# Patient Record
Sex: Female | Born: 1985 | Race: White | Hispanic: Yes | Marital: Married | Smoking: Never smoker
Health system: Southern US, Community
[De-identification: ages and names within clinical notes are randomized; demographics above are authoritative.]

## PROBLEM LIST (undated history)

## (undated) DIAGNOSIS — N390 Urinary tract infection, site not specified: Secondary | ICD-10-CM

## (undated) DIAGNOSIS — B019 Varicella without complication: Secondary | ICD-10-CM

## (undated) DIAGNOSIS — F32A Depression, unspecified: Secondary | ICD-10-CM

## (undated) DIAGNOSIS — F329 Major depressive disorder, single episode, unspecified: Secondary | ICD-10-CM

## (undated) HISTORY — PX: NO PAST SURGERIES: SHX2092

## (undated) HISTORY — DX: Major depressive disorder, single episode, unspecified: F32.9

## (undated) HISTORY — DX: Varicella without complication: B01.9

## (undated) HISTORY — DX: Urinary tract infection, site not specified: N39.0

## (undated) HISTORY — DX: Depression, unspecified: F32.A

---

## 2008-08-14 ENCOUNTER — Inpatient Hospital Stay (HOSPITAL_COMMUNITY): Admission: AD | Admit: 2008-08-14 | Discharge: 2008-08-14 | Payer: Self-pay | Admitting: Obstetrics and Gynecology

## 2008-10-07 ENCOUNTER — Ambulatory Visit (HOSPITAL_COMMUNITY): Admission: RE | Admit: 2008-10-07 | Discharge: 2008-10-07 | Payer: Self-pay | Admitting: Family Medicine

## 2008-12-26 ENCOUNTER — Ambulatory Visit: Payer: Self-pay | Admitting: Obstetrics and Gynecology

## 2008-12-26 ENCOUNTER — Inpatient Hospital Stay (HOSPITAL_COMMUNITY): Admission: AD | Admit: 2008-12-26 | Discharge: 2008-12-27 | Payer: Self-pay | Admitting: Obstetrics & Gynecology

## 2010-04-22 IMAGING — US US OB DETAIL+14 WK
1 of 2 series · 14 of 28 positions shown · non-contrast
Comparison: none

OBSTETRICAL ULTRASOUND:
 This ultrasound exam was performed in the [HOSPITAL] Ultrasound Department.  The OB US report was generated in the AS system, and faxed to the ordering physician.  This report is also available in [REDACTED] PACS.

[Series 1: us ob detail +14 wk · 0.27mm/px · 14 of 50 slices shown]
[im 1/50]
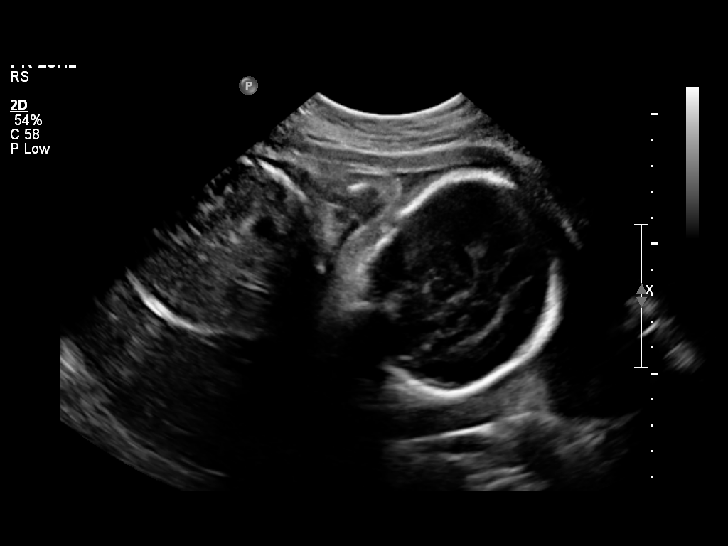
[im 4/50]
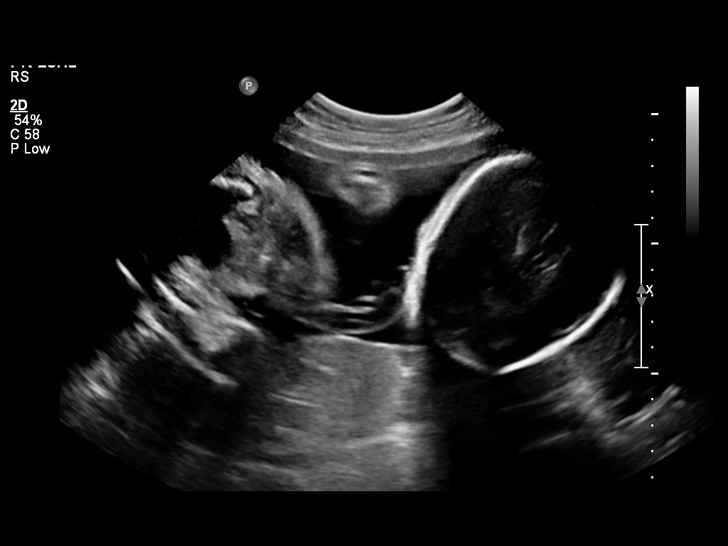
[im 8/50]
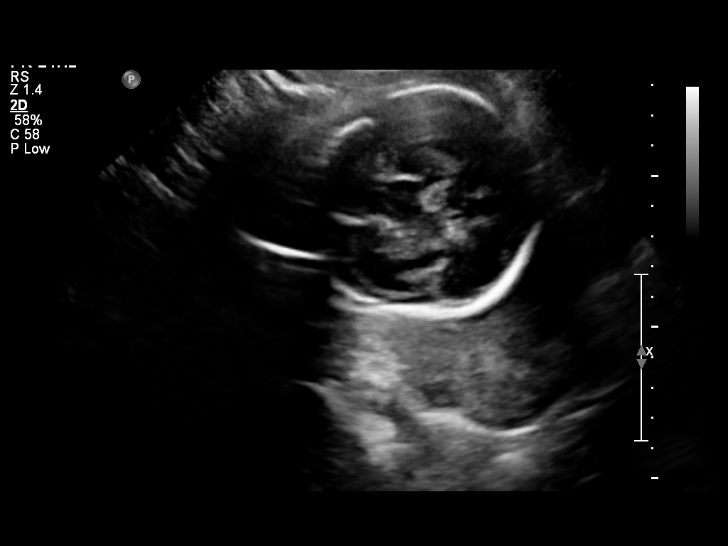
[im 12/50]
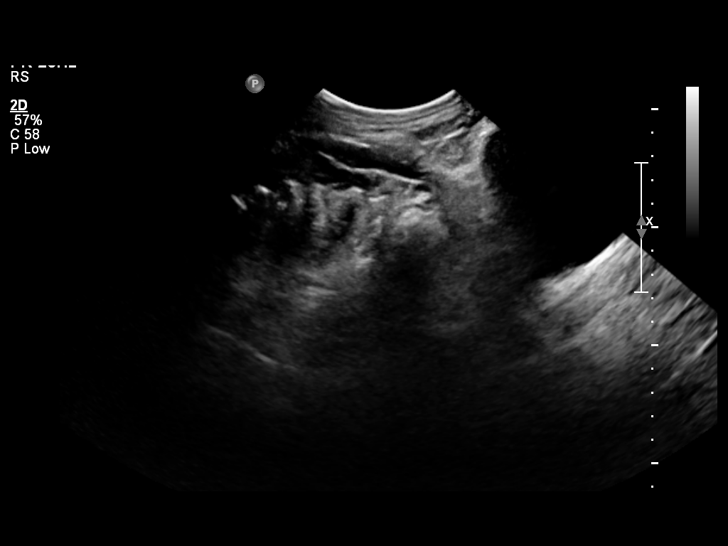
[im 16/50]
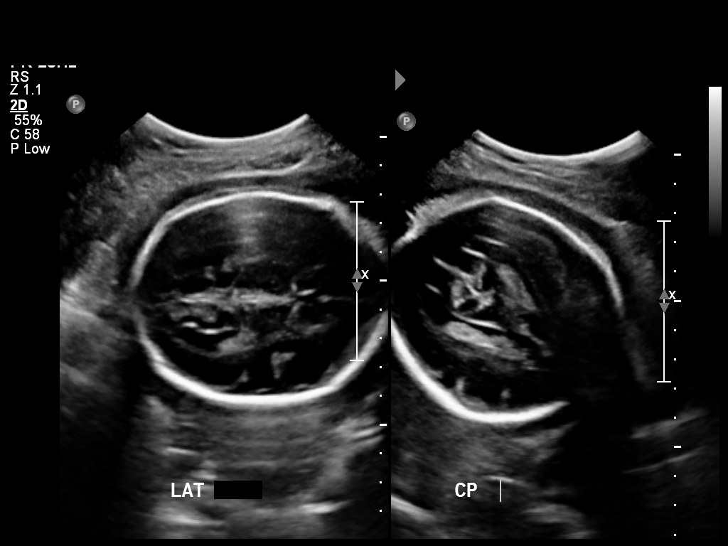
[im 19/50]
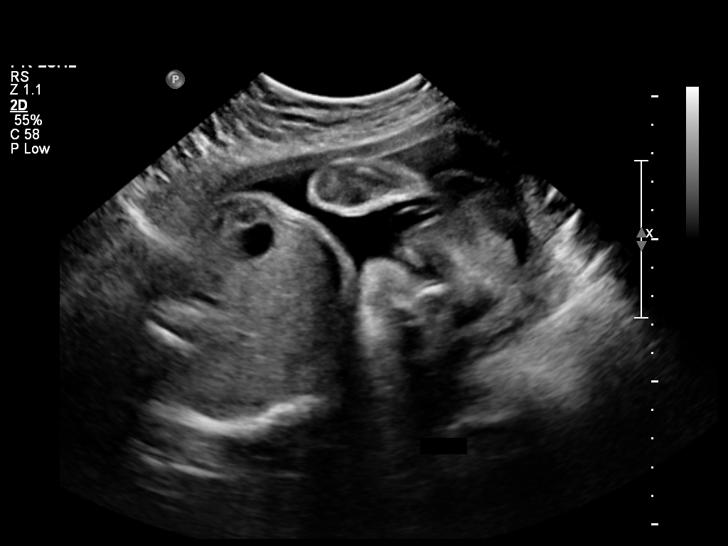
[im 23/50]
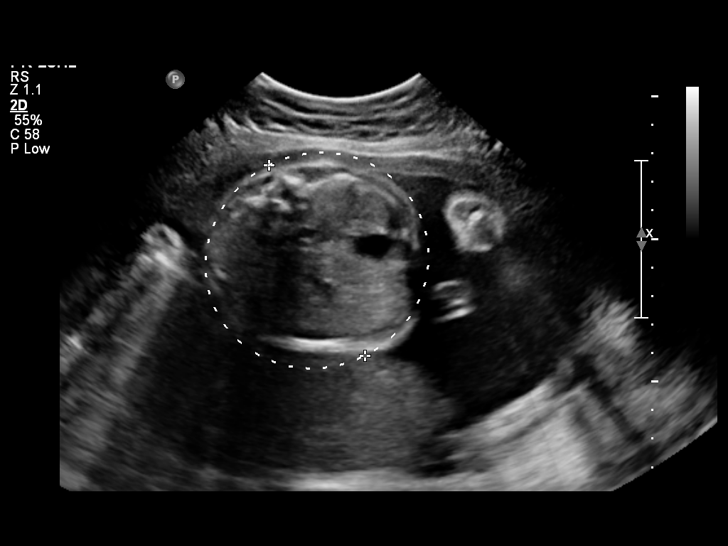
[im 27/50]
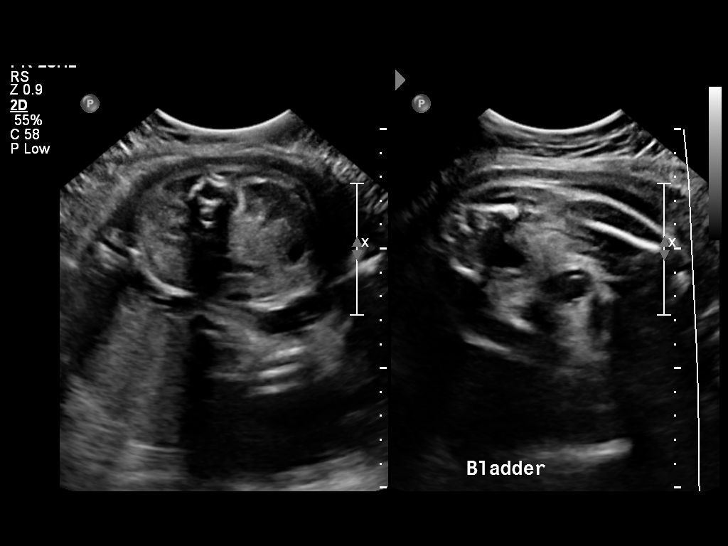
[im 31/50]
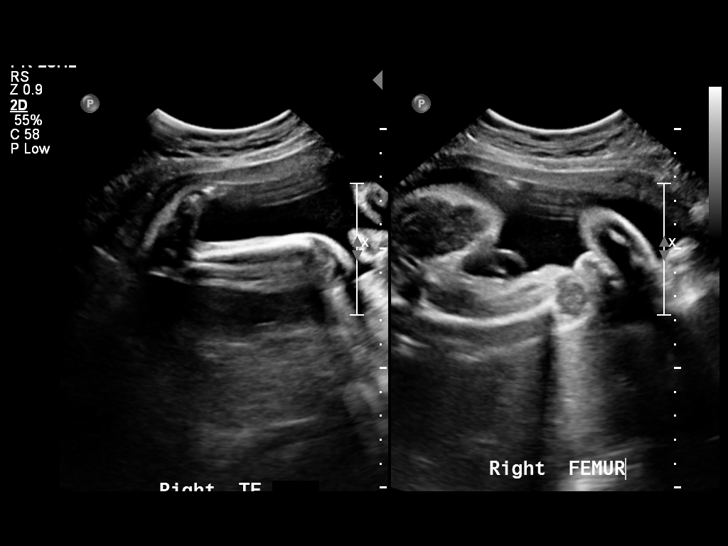
[im 34/50]
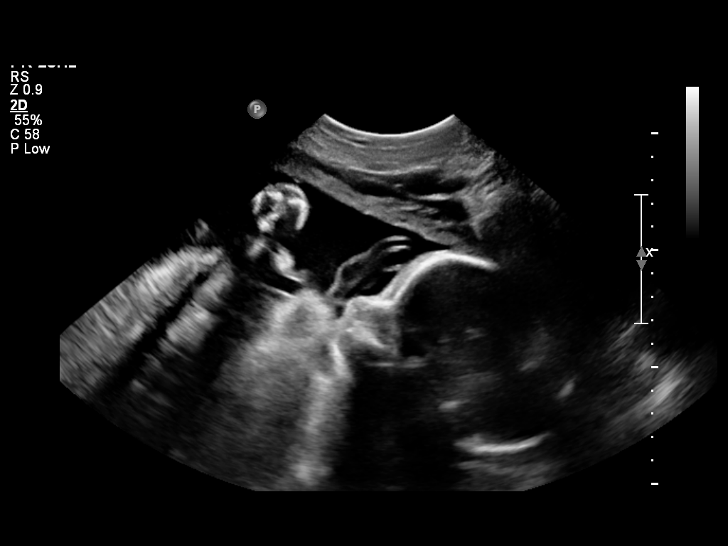
[im 38/50]
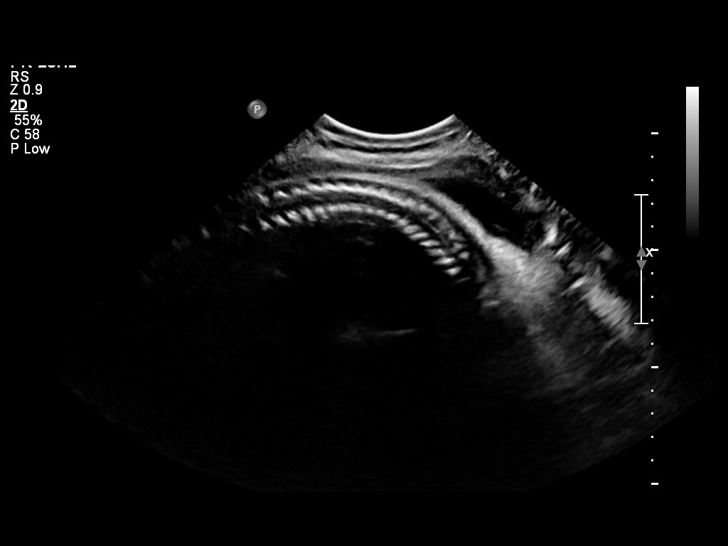
[im 42/50]
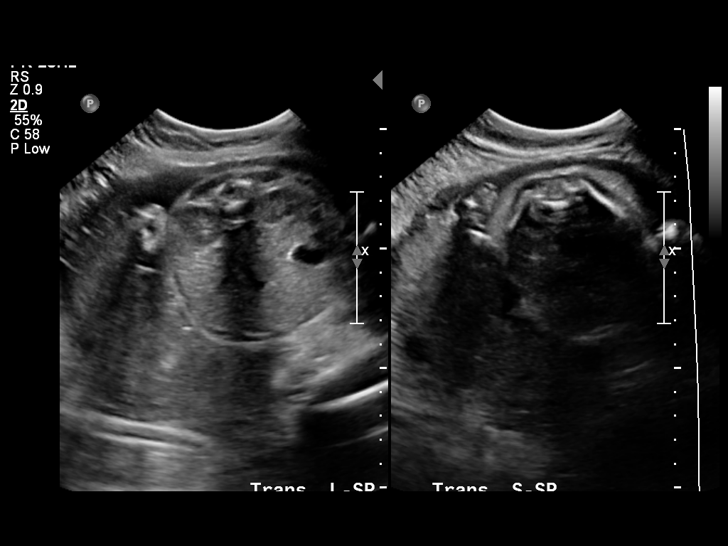
[im 46/50]
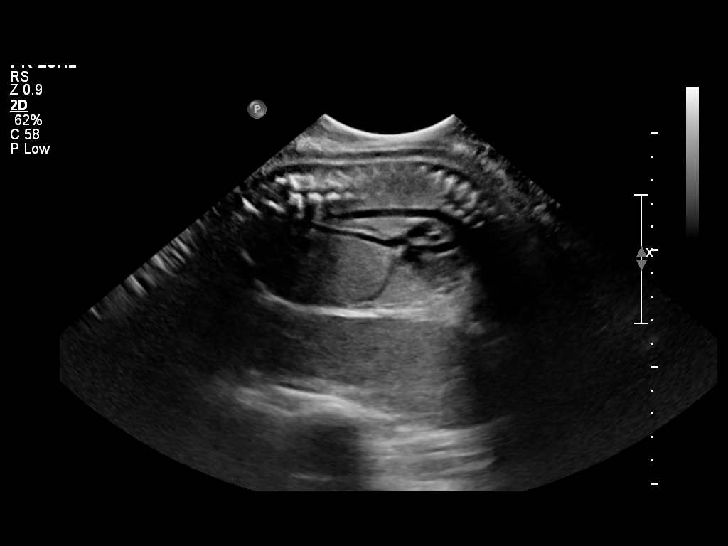
[im 50/50]
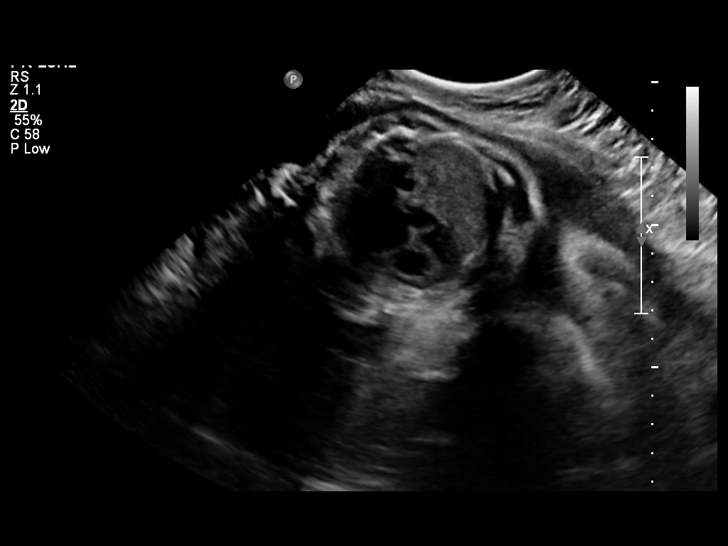

[14 of 28 positions shown; findings below may reference images not displayed]

IMPRESSION: See [facility] Obstetric US report.

## 2010-06-04 LAB — CBC
HCT: 38.6 % (ref 36.0–46.0)
MCV: 94.8 fL (ref 78.0–100.0)
Platelets: 171 10*3/uL (ref 150–400)
RDW: 12.9 % (ref 11.5–15.5)

## 2010-06-08 LAB — WET PREP, GENITAL: Yeast Wet Prep HPF POC: NONE SEEN

## 2013-09-28 ENCOUNTER — Ambulatory Visit: Payer: Self-pay | Admitting: Family Medicine

## 2013-10-26 ENCOUNTER — Ambulatory Visit (INDEPENDENT_AMBULATORY_CARE_PROVIDER_SITE_OTHER): Payer: No Typology Code available for payment source | Admitting: Family Medicine

## 2013-10-26 ENCOUNTER — Encounter: Payer: Self-pay | Admitting: Family Medicine

## 2013-10-26 VITALS — BP 110/66 | HR 76 | Temp 98.3°F | Wt 135.0 lb

## 2013-10-26 DIAGNOSIS — Z3009 Encounter for other general counseling and advice on contraception: Secondary | ICD-10-CM

## 2013-10-26 NOTE — Patient Instructions (Signed)
Contraception Choices Contraception (birth control) is the use of any methods or devices to prevent pregnancy. Below are some methods to help avoid pregnancy. HORMONAL METHODS   Contraceptive implant. This is a thin, plastic tube containing progesterone hormone. It does not contain estrogen hormone. Your health care provider inserts the tube in the inner part of the upper arm. The tube can remain in place for up to 3 years. After 3 years, the implant must be removed. The implant prevents the ovaries from releasing an egg (ovulation), thickens the cervical mucus to prevent sperm from entering the uterus, and thins the lining of the inside of the uterus.  Progesterone-only injections. These injections are given every 3 months by your health care provider to prevent pregnancy. This synthetic progesterone hormone stops the ovaries from releasing eggs. It also thickens cervical mucus and changes the uterine lining. This makes it harder for sperm to survive in the uterus.  Birth control pills. These pills contain estrogen and progesterone hormone. They work by preventing the ovaries from releasing eggs (ovulation). They also cause the cervical mucus to thicken, preventing the sperm from entering the uterus. Birth control pills are prescribed by a health care provider.Birth control pills can also be used to treat heavy periods.  Minipill. This type of birth control pill contains only the progesterone hormone. They are taken every day of each month and must be prescribed by your health care provider.  Birth control patch. The patch contains hormones similar to those in birth control pills. It must be changed once a week and is prescribed by a health care provider.  Vaginal ring. The ring contains hormones similar to those in birth control pills. It is left in the vagina for 3 weeks, removed for 1 week, and then a new one is put back in place. The patient must be comfortable inserting and removing the ring  from the vagina.A health care provider's prescription is necessary.  Emergency contraception. Emergency contraceptives prevent pregnancy after unprotected sexual intercourse. This pill can be taken right after sex or up to 5 days after unprotected sex. It is most effective the sooner you take the pills after having sexual intercourse. Most emergency contraceptive pills are available without a prescription. Check with your pharmacist. Do not use emergency contraception as your only form of birth control. BARRIER METHODS   Female condom. This is a thin sheath (latex or rubber) that is worn over the penis during sexual intercourse. It can be used with spermicide to increase effectiveness.  Female condom. This is a soft, loose-fitting sheath that is put into the vagina before sexual intercourse.  Diaphragm. This is a soft, latex, dome-shaped barrier that must be fitted by a health care provider. It is inserted into the vagina, along with a spermicidal jelly. It is inserted before intercourse. The diaphragm should be left in the vagina for 6 to 8 hours after intercourse.  Cervical cap. This is a round, soft, latex or plastic cup that fits over the cervix and must be fitted by a health care provider. The cap can be left in place for up to 48 hours after intercourse.  Sponge. This is a soft, circular piece of polyurethane foam. The sponge has spermicide in it. It is inserted into the vagina after wetting it and before sexual intercourse.  Spermicides. These are chemicals that kill or block sperm from entering the cervix and uterus. They come in the form of creams, jellies, suppositories, foam, or tablets. They do not require a   prescription. They are inserted into the vagina with an applicator before having sexual intercourse. The process must be repeated every time you have sexual intercourse. INTRAUTERINE CONTRACEPTION  Intrauterine device (IUD). This is a T-shaped device that is put in a woman's uterus  during a menstrual period to prevent pregnancy. There are 2 types:  Copper IUD. This type of IUD is wrapped in copper wire and is placed inside the uterus. Copper makes the uterus and fallopian tubes produce a fluid that kills sperm. It can stay in place for 10 years.  Hormone IUD. This type of IUD contains the hormone progestin (synthetic progesterone). The hormone thickens the cervical mucus and prevents sperm from entering the uterus, and it also thins the uterine lining to prevent implantation of a fertilized egg. The hormone can weaken or kill the sperm that get into the uterus. It can stay in place for 3-5 years, depending on which type of IUD is used. PERMANENT METHODS OF CONTRACEPTION  Female tubal ligation. This is when the woman's fallopian tubes are surgically sealed, tied, or blocked to prevent the egg from traveling to the uterus.  Hysteroscopic sterilization. This involves placing a small coil or insert into each fallopian tube. Your doctor uses a technique called hysteroscopy to do the procedure. The device causes scar tissue to form. This results in permanent blockage of the fallopian tubes, so the sperm cannot fertilize the egg. It takes about 3 months after the procedure for the tubes to become blocked. You must use another form of birth control for these 3 months.  Female sterilization. This is when the female has the tubes that carry sperm tied off (vasectomy).This blocks sperm from entering the vagina during sexual intercourse. After the procedure, the man can still ejaculate fluid (semen). NATURAL PLANNING METHODS  Natural family planning. This is not having sexual intercourse or using a barrier method (condom, diaphragm, cervical cap) on days the woman could become pregnant.  Calendar method. This is keeping track of the length of each menstrual cycle and identifying when you are fertile.  Ovulation method. This is avoiding sexual intercourse during ovulation.  Symptothermal  method. This is avoiding sexual intercourse during ovulation, using a thermometer and ovulation symptoms.  Post-ovulation method. This is timing sexual intercourse after you have ovulated. Regardless of which type or method of contraception you choose, it is important that you use condoms to protect against the transmission of sexually transmitted infections (STIs). Talk with your health care provider about which form of contraception is most appropriate for you. Document Released: 02/15/2005 Document Revised: 02/20/2013 Document Reviewed: 08/10/2012 Exit[employer] Patient Information 2015 [employer]. This information is not intended to replace advice given to you by your health care provider. Make sure you discuss any questions you have with your health care provider.  

## 2013-10-26 NOTE — Progress Notes (Signed)
   Subjective:    Patient ID: Shawna Erickson, female    DOB: 12/28/1985, 28 y.o.   MRN: [id number]  HPI Patient here to establish care. She speaks very little [place] and is accompanied by interpreter. She has no significant chronic medical problems. Takes no current medications. Has previously gotten contraceptive care through the health Department. She currently has Implanon and apparently this comes out in October. She would like to explore other contraceptive methods. She apparently did Depo-Provera shots for several years. She has 41-year-old and 86-year-old children. She is not sure that she wishes to look at permanent sterilization options at this time. She denies any prior surgeries. Nonsmoker. No regular alcohol use.  She is married and has 2 children as above. She works as an Agricultural engineer.  Past Medical History  Diagnosis Date  . Depression   . Urinary tract infection   . Chicken pox    History reviewed. No pertinent past surgical history.  reports that she has never smoked. She does not have any smokeless tobacco history on file. She reports that she does not drink alcohol or use illicit drugs. family history includes Hypertension in her mother. No Known Allergies    Review of Systems  Constitutional: Negative for fever, appetite change and unexpected weight change.  Respiratory: Negative for cough and shortness of breath.   Cardiovascular: Negative for chest pain.  Gastrointestinal: Negative for abdominal pain.  Genitourinary: Negative for dysuria.  Psychiatric/Behavioral: Negative for dysphoric mood.       Objective:   Physical Exam  Constitutional: She appears well-developed and well-nourished.  Neck: Neck supple. No thyromegaly present.  Cardiovascular: Normal rate and regular rhythm.   Pulmonary/Chest: Effort normal and breath sounds normal. No respiratory distress. She has no wheezes. She has no rales.  Musculoskeletal: She exhibits no edema.           Assessment & Plan:  Contraception. She plans to get removal of Implanon in October through the health Department. We've recommended complete physical in October and she needs followup Pap smear then as she's not had one 3 years and we gave her handout today to consider other birth control options.

## 2013-10-26 NOTE — Progress Notes (Signed)
Pre visit review using our clinic review tool, if applicable. No additional management support is needed unless otherwise documented below in the visit note. 

## 2020-03-01 NOTE — L&D Delivery Note (Signed)
OB/[facility]N Faculty Practice Delivery Note  Shawna Erickson is a 35 y.o. [id number] s/p twin SVD at [redacted]w[redacted]d. She was admitted for SOL with urge to push.   ROM: 1h 46m with clear fluid GBS Status: unknown Maximum Maternal Temperature: none taken prior to delivery due to precipitous births  Labor Progress: Pt arrived to MAU with strong urge to push and was found to be 9.5cm. Transferred from MAU to L&D less than before birth of Baby A.  Delivery Date/Time: A: 11/28/20 at 1712, B: 11/28/20 at 1733 Delivery: Baby A delivered ROA. No nuchal cord present. Shoulder and body delivered in usual fashion. Infant with spontaneous cry, placed on mother's abdomen, dried and stimulated. Cord clamped x 2 after 1-minute delay, and cut by FOB. Cord blood drawn.   Felt for positioning of Baby B = still vertex with intact bag, AROM performed. Baby B delivered LOA with nuchal and body cord x2. Shoulder and body delivered in usual fashion, nuchal and body cords easily untangled. Cord clamped x2 after 1-minute delay, and cut by FOB. Cord blood drawn. Placentas delivered spontaneously, intact, with 3-vessel cords. Fundus initially firm with massage and Pitocin but then began a steady stream with large clots. TXA hung and methergine given, lower uterine sweep performed for removal of clots and uterus firmed up. Bleeding stopped. Labia, perineum, vagina, and cervix inspected, second degree perineal laceration found and repaired with 3.0 vicryl.   Placentas: spontaneous, intact, sent to pathology Complications: uterine atony Lacerations: 1st degree perineal EBL: 500 Analgesia: none, fentanyl given for repair  Postpartum Planning [x]  transfer orders to MB [x]  discharge summary started & shared [x]  message to sent to schedule follow-up  [x]  lists updated [x]  vaccines UTD  Infant A: Boy(no)  APGARs 9/9  2515g Infant B: Girl  APGARs 9/9  2824g  , CNM, IBCLC Certified Nurse Midwife, [facility] for , [facility] 11/28/2020, 6:19 PM

## 2020-09-25 ENCOUNTER — Encounter: Payer: Self-pay | Admitting: General Practice

## 2020-10-02 ENCOUNTER — Ambulatory Visit (INDEPENDENT_AMBULATORY_CARE_PROVIDER_SITE_OTHER): Payer: Medicaid Other | Admitting: Obstetrics and Gynecology

## 2020-10-02 ENCOUNTER — Other Ambulatory Visit (HOSPITAL_COMMUNITY)
Admission: RE | Admit: 2020-10-02 | Discharge: 2020-10-02 | Disposition: A | Discharge status: Home / Self Care | Payer: Self-pay | Source: Ambulatory Visit | Attending: Obstetrics and Gynecology | Admitting: Obstetrics and Gynecology

## 2020-10-02 ENCOUNTER — Other Ambulatory Visit: Payer: Self-pay

## 2020-10-02 DIAGNOSIS — O30003 Twin pregnancy, unspecified number of placenta and unspecified number of amniotic sacs, third trimester: Secondary | ICD-10-CM | POA: Diagnosis not present

## 2020-10-02 DIAGNOSIS — Z3A27 27 weeks gestation of pregnancy: Secondary | ICD-10-CM | POA: Diagnosis not present

## 2020-10-02 DIAGNOSIS — O0933 Supervision of pregnancy with insufficient antenatal care, third trimester: Secondary | ICD-10-CM

## 2020-10-02 DIAGNOSIS — N898 Other specified noninflammatory disorders of vagina: Secondary | ICD-10-CM

## 2020-10-02 DIAGNOSIS — O26893 Other specified pregnancy related conditions, third trimester: Secondary | ICD-10-CM | POA: Insufficient documentation

## 2020-10-02 DIAGNOSIS — O0990 Supervision of high risk pregnancy, unspecified, unspecified trimester: Secondary | ICD-10-CM | POA: Diagnosis not present

## 2020-10-02 LAB — OB RESULTS CONSOLE VARICELLA ZOSTER ANTIBODY, IGG: Varicella: IMMUNE

## 2020-10-02 LAB — OB RESULTS CONSOLE GC/CHLAMYDIA: Gonorrhea: NEGATIVE

## 2020-10-02 MED ORDER — PRENATAL VITAMIN 27-0.8 MG PO TABS: 1.0000 | ORAL_TABLET | Freq: Every day | ORAL | 11 refills | Status: DC

## 2020-10-02 MED ORDER — TERCONAZOLE 0.8 % VA CREA: 1.0000 | TOPICAL_CREAM | Freq: Every day | VAGINAL | 0 refills | Status: DC

## 2020-10-02 NOTE — Progress Notes (Signed)
INITIAL PRENATAL VISIT NOTE  Subjective:  Shawna Erickson is a 35 y.o. G3P2002 at [redacted]w[redacted]d by LMP being seen today for her initial prenatal visit. She has an obstetric history significant for SVD x 2, hx of previa x 2 in other pregnancies. She has a medical history significant for nothing.  The patient is a late care patient with only an interview being done at the health department.  No formal ultrasounds have been done.  Pt had a vanity scan done at an outside facility.  Chorionicity of twin pregnancy is unknown.  Patient reports no complaints.  Contractions: Irritability. Vag. Bleeding: None.  Movement: Present. Denies leaking of fluid.    Past Medical History:  Diagnosis Date   Chicken pox    Depression    Urinary tract infection     No past surgical history on file.  OB History  Gravida Para Term Preterm AB Living  3 2 2  0 0 2  SAB IAB Ectopic Multiple Live Births  0 0 0 0 2    # Outcome Date GA Lbr Len/2nd Weight Sex Delivery Anes PTL Lv  3 Current           2 Term 12/26/08 [redacted]w[redacted]d  7 lb (3.175 kg) F      1 Term 01/07/05 [redacted]w[redacted]d  7 lb 11 oz (3.487 kg) M        Social History   Socioeconomic History   Marital status: Married    Spouse name: Not on file   Number of children: Not on file   Years of education: Not on file   Highest education level: Not on file  Occupational History   Not on file  Tobacco Use   Smoking status: Never   Smokeless tobacco: Not on file  Substance and Sexual Activity   Alcohol use: No   Drug use: No   Sexual activity: Not on file  Other Topics Concern   Not on file  Social History Narrative   Not on file   Social Determinants of Health   Financial Resource Strain: Not on file  Food Insecurity: Not on file  Transportation Needs: Not on file  Physical Activity: Not on file  Stress: Not on file  Social Connections: Not on file    Family History  Problem Relation Age of Onset   Hypertension Mother      Current  Outpatient Medications:    FOLIC ACID PO, Take by mouth., Disp: , Rfl:    Prenatal Vit-Fe Fumarate-FA (PRENATAL VITAMIN) 27-0.8 MG TABS, Take 1 tablet by mouth daily., Disp: 30 tablet, Rfl: 11   terconazole (TERAZOL 3) 0.8 % vaginal cream, Place 1 applicator vaginally at bedtime. Apply nightly for three nights., Disp: 20 g, Rfl: 0  No Known Allergies  Review of Systems: Negative except for what is mentioned in HPI.  Objective:   Vitals:   10/02/20 0902 10/02/20 0916  BP: 106/71   Pulse: 94   Weight: 153 lb 1.6 oz (69.4 kg)   Height:  5\' 4"  (1.626 m)    Fetal Status: Fetal Heart Rate (bpm): 144/152   Movement: Present     Physical Exam: BP 106/71   Pulse 94   Ht 5\' 4"  (1.626 m)   Wt 153 lb 1.6 oz (69.4 kg)   LMP 03/23/2020 (Exact Date)   BMI 26.28 kg/m  CONSTITUTIONAL: Well-developed, well-nourished female in no acute distress.  NEUROLOGIC: Alert and oriented to person, place, and time. Normal reflexes, muscle tone coordination. No  cranial nerve deficit noted. PSYCHIATRIC: Normal mood and affect. Normal behavior. Normal judgment and thought content. SKIN: Skin is warm and dry. No rash noted. Not diaphoretic. No erythema. No pallor. HENT:  Normocephalic, atraumatic, External right and left ear normal. Oropharynx is clear and moist EYES: Conjunctivae and EOM are normal.  NECK: Normal range of motion, supple, no masses CARDIOVASCULAR: Normal heart rate noted, regular rhythm RESPIRATORY: Effort and breath sounds normal, no problems with respiration noted BREASTS: deferred ABDOMEN: Soft, nontender, nondistended, gravid. GU: normal appearing external female genitalia, multiparous, extremely friable  cervix, think, chunky white discharge in vagina, no lesions noted, pap taken Bimanual: 28 weeks sized uterus, no adnexal tenderness or palpable lesions noted MUSCULOSKELETAL: Normal range of motion. EXT:  No edema and no tenderness. 2+ distal pulses.   Assessment and Plan:   Pregnancy: G3P2002 at [redacted]w[redacted]d by LMP  1. Supervision of high risk pregnancy, antepartum Will get all PNC labs,2 hour GTT next visit - Prenatal Vit-Fe Fumarate-FA (PRENATAL VITAMIN) 27-0.8 MG TABS; Take 1 tablet by mouth daily.  Dispense: 30 tablet; Refill: 11 - [place] MFM OB DETAIL +14 WK; Future - Hemoglobin A1c - Culture, OB Urine - CBC/D/Plt+RPR+Rh+ABO+RubIgG... - [place] MFM OB DETAIL ADDL GEST +14 WK; Future  2. [redacted] weeks gestation of pregnancy   3. Twin gestation in third trimester, unspecified multiple gestation type Chorionicity is unknown, only ultrasound is from a vanity scan, no formal scans done this pregnancy  U/s ordered with MFM ASAP due to twin gestation and AMA  4. Vaginal discharge in preg Visually yeast, treating empirically until swab results return   Preterm labor symptoms and general obstetric precautions including but not limited to vaginal bleeding, contractions, leaking of fluid and fetal movement were reviewed in detail with the patient.  Please refer to After Visit Summary for other counseling recommendations.   Return in about 2 weeks (around 10/16/2020) for [facility], in person, 2 hr GTT.  Warden Fillers 10/02/2020 9:59 AM

## 2020-10-03 LAB — CERVICOVAGINAL ANCILLARY ONLY
Bacterial Vaginitis (gardnerella): NEGATIVE
Candida Glabrata: NEGATIVE
Candida Vaginitis: NEGATIVE
Chlamydia: NEGATIVE
Comment: NEGATIVE
Comment: NEGATIVE
Comment: NEGATIVE
Comment: NEGATIVE
Comment: NEGATIVE
Comment: NORMAL
Neisseria Gonorrhea: NEGATIVE
Trichomonas: NEGATIVE

## 2020-10-04 LAB — CULTURE, OB URINE

## 2020-10-04 LAB — URINE CULTURE, OB REFLEX

## 2020-10-05 LAB — CBC/D/PLT+RPR+RH+ABO+RUBIGG...
Antibody Screen: NEGATIVE
Basophils Absolute: 0 10*3/uL (ref 0.0–0.2)
Basos: 0 %
EOS (ABSOLUTE): 0.1 10*3/uL (ref 0.0–0.4)
Eos: 1 %
HCV Ab: <0.1 s/co ratio (ref 0.0–0.9)
HIV Screen 4th Generation wRfx: NONREACTIVE
Hematocrit: 34.9 % (ref 34.0–46.6)
Hemoglobin: 12.1 g/dL (ref 11.1–15.9)
Hepatitis B Surface Ag: NEGATIVE
Immature Grans (Abs): 0.2 10*3/uL — ABNORMAL HIGH (ref 0.0–0.1)
Immature Granulocytes: 2 %
Lymphocytes Absolute: 1.9 10*3/uL (ref 0.7–3.1)
Lymphs: 17 %
MCH: 31.9 pg (ref 26.6–33.0)
MCHC: 34.7 g/dL (ref 31.5–35.7)
MCV: 92 fL (ref 79–97)
Monocytes Absolute: 0.4 10*3/uL (ref 0.1–0.9)
Monocytes: 4 %
Neutrophils Absolute: 8 10*3/uL — ABNORMAL HIGH (ref 1.4–7.0)
Neutrophils: 76 %
Platelets: 192 10*3/uL (ref 150–450)
RBC: 3.79 x10E6/uL (ref 3.77–5.28)
RDW: 13.2 % (ref 11.7–15.4)
RPR Ser Ql: NONREACTIVE
Rh Factor: POSITIVE
Rubella Antibodies, IGG: 2.5 index (ref >0.99)
WBC: 10.6 10*3/uL (ref 3.4–10.8)

## 2020-10-05 LAB — HEMOGLOBIN A1C
Est. average glucose Bld gHb Est-mCnc: 111 mg/dL
Hgb A1c MFr Bld: 5.5 % (ref 4.8–5.6)

## 2020-10-05 LAB — HCV INTERPRETATION

## 2020-10-07 LAB — CYTOLOGY - PAP
Chlamydia: NEGATIVE
Comment: NEGATIVE
Comment: NEGATIVE
Comment: NORMAL
Diagnosis: NEGATIVE
High risk HPV: NEGATIVE
Neisseria Gonorrhea: NEGATIVE

## 2020-10-14 ENCOUNTER — Other Ambulatory Visit: Payer: Self-pay

## 2020-10-15 ENCOUNTER — Encounter: Payer: Self-pay | Admitting: General Practice

## 2020-10-24 ENCOUNTER — Other Ambulatory Visit: Payer: Self-pay

## 2020-10-24 ENCOUNTER — Other Ambulatory Visit: Payer: Self-pay | Admitting: General Practice

## 2020-10-24 DIAGNOSIS — O0990 Supervision of high risk pregnancy, unspecified, unspecified trimester: Secondary | ICD-10-CM

## 2020-10-25 LAB — GLUCOSE TOLERANCE, 2 HOURS W/ 1HR
Glucose, 1 hour: 178 mg/dL (ref 65–179)
Glucose, 2 hour: 176 mg/dL — ABNORMAL HIGH (ref 65–152)
Glucose, Fasting: 71 mg/dL (ref 65–91)

## 2020-10-28 ENCOUNTER — Telehealth: Payer: Self-pay

## 2020-10-28 DIAGNOSIS — O24419 Gestational diabetes mellitus in pregnancy, unspecified control: Secondary | ICD-10-CM

## 2020-10-28 NOTE — Telephone Encounter (Signed)
-----   Message from Warden Fillers, MD sent at 10/28/2020  1:49 PM EDT ----- Failed 2 hour Gtt needs diabetic teaching

## 2020-10-28 NOTE — Telephone Encounter (Signed)
Call placed to pt. Spoke with pt. Pt given results and recommendations per Dr Donavan Foil. Pt verbalized understanding and agreeable to plan of care. Pt set up for diabetes education on 9/1 at 1:15pm. Pt agreeable to date and time of appt.   Laney Pastor

## 2020-10-30 ENCOUNTER — Encounter: Payer: Self-pay | Admitting: *Deleted

## 2020-10-30 ENCOUNTER — Ambulatory Visit: Payer: Medicaid Other | Admitting: *Deleted

## 2020-10-30 ENCOUNTER — Other Ambulatory Visit: Payer: Self-pay

## 2020-10-30 ENCOUNTER — Ambulatory Visit: Payer: Self-pay | Admitting: Registered"

## 2020-10-30 ENCOUNTER — Ambulatory Visit (HOSPITAL_BASED_OUTPATIENT_CLINIC_OR_DEPARTMENT_OTHER): Payer: Medicaid Other | Admitting: Maternal & Fetal Medicine

## 2020-10-30 ENCOUNTER — Ambulatory Visit: Payer: Medicaid Other | Attending: Obstetrics and Gynecology

## 2020-10-30 ENCOUNTER — Other Ambulatory Visit: Payer: Self-pay | Admitting: *Deleted

## 2020-10-30 VITALS — BP 102/61 | HR 79

## 2020-10-30 DIAGNOSIS — O30043 Twin pregnancy, dichorionic/diamniotic, third trimester: Secondary | ICD-10-CM | POA: Insufficient documentation

## 2020-10-30 DIAGNOSIS — O24410 Gestational diabetes mellitus in pregnancy, diet controlled: Secondary | ICD-10-CM

## 2020-10-30 DIAGNOSIS — O24419 Gestational diabetes mellitus in pregnancy, unspecified control: Secondary | ICD-10-CM

## 2020-10-30 DIAGNOSIS — O0990 Supervision of high risk pregnancy, unspecified, unspecified trimester: Secondary | ICD-10-CM

## 2020-10-30 NOTE — Progress Notes (Signed)
MFM Brief Note  Diamniotic Dichorionic pregnancy at an advanced gestational age of [redacted] weeks.  Shawna Erickson is a G3P2 who is here for a detailed examination for DiDi twin pregnancy. Normal anatomy with good amniotic fluid and fetal movement was observed in Twin A and B. Suboptimal views of the fetal anatomy were obtained secondary to fetal position and advance gestational age.  Twin discordance of 12%.  I reviewed the normal nature of today's ultrasound. Shawna Erickson conveyed that she is not taking low dose aspirin for preeclampsia prevention, as she was further along in gestation at the time of her prenatal care.  She had a low risk NIPS and negative horizon. However, she was recently diagnosed with GDM but has not begun checking her blood sugars yet. She has her diabetic education visit today.  We reviewed the sonographic findings and limitations of ultrasound. The potential risks associated with a twin gestation were discussed.  This discussion included a review of the increased risk of miscarriages, anomalies, preterm labor, and/or delivery, malpresentation, delivery via cesarean section, gestational diabetes, and/or preeclampsia.  With regards to fetal risks, there is an increased risk for fetal growth restriction of one or both twins, preterm labor, and associated morbidity, and intrauterine fetal demise.    We recommend growth scans every 4 weeks starting at 24 weeks with the initation of weekly antenatal testing in the form of twice weekly NST or weekly BPP should abnormal fetal growth or intertwin discordance of greater than 20-25% is noted.   Regarding her new diagnosis of GDM. We discussed the mainstay of GDM management includes FBS 60-90 and 2hr pp <120 mg/dL. I discussed the role of nutrition, exercise and medical therapy. We focused on foods that increase blood sugar as well as timing and size of her meals. Lastly I recommended the goal of a 30 minute walk after dinner. She  expressed an understanding of our discussion today.  Her blood pressure was 102/61 mmHg.   Following counseling, all questions were addressed.    Recommendations: Repeat growth in 4 weeks.   I spent 30 minutes with > 50% in face to face consultation.  Novella Olive, MD.

## 2020-10-31 DIAGNOSIS — O24419 Gestational diabetes mellitus in pregnancy, unspecified control: Secondary | ICD-10-CM | POA: Insufficient documentation

## 2020-10-31 NOTE — Progress Notes (Signed)
Spanish interpreter Judeth Cornfield [phone] from [employer]  Husband present for visit.  Patient was seen for Gestational Diabetes self-management on 10/30/20  Start time 1320 and End time 1420   Estimated due date: 12/28/20; [redacted]w[redacted]d  Clinical: Medications: vitamins Medical History: reviewed Labs: OGTT elevated 2 hr, A1c 5.5%  10/02/20  Dietary and Lifestyle History: Patient states no history of GDM  Physical Activity: ADLs limited due to abdominal pressure and pain in legs, uses pregnancy belt  Stress: stress relievers include reading, coloring, music Sleep: not assessed  24 hr Recall: First Meal: milk, peanut butter and bread OR egg, toast, decaf coffee Snack:none Second meal: salad, picante soup, 3-4 tortillas Snack: Third meal: yogurt or milk & muffin Snack: Beverages: water, juice  NUTRITION INTERVENTION  Nutrition education (E-1) on the following topics:   Initial Follow-up  [x]  []  Definition of Gestational Diabetes []  []  Why dietary management is important in controlling blood glucose [x]  []  Effects each nutrient has on blood glucose levels []  []  Simple carbohydrates vs complex carbohydrates [x]  []  Fluid intake [x]  []  Creating a balanced meal plan [x]  []  Carbohydrate counting  [x]  []  When to check blood glucose levels [x]  []  Proper blood glucose monitoring techniques [x]  []  Effect of stress and stress reduction techniques  [x]  []  Exercise effect on blood glucose levels, appropriate exercise during pregnancy [x]  []  Importance of limiting caffeine and abstaining from alcohol and smoking [x]  []  Medications used for blood sugar control during pregnancy [x]  []  Hypoglycemia and rule of 15 [x]  []  Postpartum self care  Blood glucose monitor given: Prodigy Lot # CBG: 126 mg/dL  Patient instructed to monitor glucose levels: FBS: 60 - ? 95 mg/dL (some clinics use 90 for cutoff) 1 hour: ? 140 mg/dL 2 hour: ? mg/dL  Patient received handouts: Nutrition Diabetes  and Pregnancy Carbohydrate Counting List Planning Healthy Meals fold-out in Spanish  Patient will be seen for follow-up as needed.

## 2020-11-06 ENCOUNTER — Telehealth: Payer: Self-pay | Admitting: Lactation Services

## 2020-11-06 NOTE — Telephone Encounter (Signed)
Patient called and LM on lactation line. She reports she has questions about an ingredient in her PNV and if it is safe for the baby.

## 2020-11-06 NOTE — Telephone Encounter (Signed)
Returned patients call with assistance of [employer], Research officer, trade union.   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   Patient with no further questions or concerns.   Patient asked who she can call if she has complications with the pregnancy, advised to call the office and we will call her back with an interpreter.

## 2020-11-27 ENCOUNTER — Ambulatory Visit: Payer: Medicaid Other | Attending: Maternal & Fetal Medicine

## 2020-11-27 ENCOUNTER — Ambulatory Visit: Payer: Self-pay | Admitting: Registered"

## 2020-11-27 ENCOUNTER — Encounter: Payer: Self-pay | Attending: Obstetrics and Gynecology | Admitting: Registered"

## 2020-11-27 ENCOUNTER — Ambulatory Visit: Payer: Medicaid Other | Admitting: *Deleted

## 2020-11-27 ENCOUNTER — Other Ambulatory Visit: Payer: Self-pay

## 2020-11-27 ENCOUNTER — Other Ambulatory Visit: Payer: Self-pay | Admitting: *Deleted

## 2020-11-27 ENCOUNTER — Encounter: Payer: Self-pay | Admitting: *Deleted

## 2020-11-27 ENCOUNTER — Ambulatory Visit (HOSPITAL_BASED_OUTPATIENT_CLINIC_OR_DEPARTMENT_OTHER): Payer: Medicaid Other | Admitting: *Deleted

## 2020-11-27 VITALS — BP 108/65 | HR 80

## 2020-11-27 DIAGNOSIS — Z3A35 35 weeks gestation of pregnancy: Secondary | ICD-10-CM | POA: Diagnosis present

## 2020-11-27 DIAGNOSIS — Z3A00 Weeks of gestation of pregnancy not specified: Secondary | ICD-10-CM | POA: Insufficient documentation

## 2020-11-27 DIAGNOSIS — O0990 Supervision of high risk pregnancy, unspecified, unspecified trimester: Secondary | ICD-10-CM | POA: Diagnosis present

## 2020-11-27 DIAGNOSIS — O30043 Twin pregnancy, dichorionic/diamniotic, third trimester: Secondary | ICD-10-CM

## 2020-11-27 DIAGNOSIS — O09523 Supervision of elderly multigravida, third trimester: Secondary | ICD-10-CM | POA: Diagnosis not present

## 2020-11-27 DIAGNOSIS — O24410 Gestational diabetes mellitus in pregnancy, diet controlled: Secondary | ICD-10-CM

## 2020-11-27 DIAGNOSIS — O24419 Gestational diabetes mellitus in pregnancy, unspecified control: Secondary | ICD-10-CM

## 2020-11-27 DIAGNOSIS — O0933 Supervision of pregnancy with insufficient antenatal care, third trimester: Secondary | ICD-10-CM | POA: Diagnosis not present

## 2020-11-27 NOTE — Procedures (Signed)
Ovella Manygoats [birth year] [redacted]w[redacted]d  Fetus A Non-Stress Test Interpretation for 11/27/20  Indication:  GDM-diet  Fetal Heart Rate A Mode: External Baseline Rate (A): 130 bpm Variability: Moderate Accelerations: 15 x 15 Decelerations: None Multiple birth?: Yes  Uterine Activity Mode: Palpation, Toco Contraction Frequency (min): 7-8 w/UI Contraction Duration (sec): 20-90 Contraction Quality: Mild Resting Tone Palpated: Relaxed Resting Time: Adequate  Interpretation (Fetal Testing) Nonstress Test Interpretation: Reactive Comments: Dr. Parke Poisson reviewed tracing.  Shanai Lartigue Oct 25, 1985 [redacted]w[redacted]d   Fetus B Non-Stress Test Interpretation for 11/27/20  Indication:  GDM-diet  Fetal Heart Rate Fetus B Mode: External Baseline Rate (B): 130 BPM Accelerations: 15 x 15 Decelerations: None  Uterine Activity Mode: Palpation, Toco Contraction Frequency (min): 7-8 w/UI Contraction Duration (sec): 20-90 Contraction Quality: Mild Resting Tone Palpated: Relaxed Resting Time: Adequate

## 2020-11-27 NOTE — Progress Notes (Signed)
Spanish interpreter Elease Hashimoto [phone] from [employer] This patient is accompanied in the office by her spouse.  Patient was seen for Gestational Diabetes self-management follow up on 11/27/20  Start time 1045 and End time 1115  Estimated due date: 12/28/20; 101w4d  Clinical: Medications: vitamins Medical History: reviewed Patient states no history of GDM Labs: OGTT elevated 2 hr, A1c 5.5%  10/02/20  Dietary and Lifestyle History: Patient states she has been following the handout she received in last visit for meal guidance. Patient states she is still hungry after some meals and will just wait and have a snack later. Patient was encouraged to increase her food intake.  Today FBS was 65 mg/dL, patient states she felt fine this morning. Patient states the only difference today from other mornings is she checked it about 1 hour earlier than she usually does. Patient was able to identify meals that elevate her blood sugar, but mostly has values WNL for last 2 weeks. Patient reports occasionally will have 1/2 sweet/ 1/2 unsweet tea when eating out.  Patient had a sore on her l middle finger and states it has been there a long time and is not concerned about it. Patient states she just avoids the immediate area when pricking her finger.  Physical Activity: ADLs limited due to abdominal pressure and pain in legs, uses pregnancy belt  Stress: stress relievers include reading, coloring, music Sleep: not assessed    24 hr Recall: First Meal: milk, egg, toast Snack: nuts Second meal: soup, eggs, sausage, 3 tortillas, salad Snack: Third meal: 3 steak tacos Snack: milk, toast, peanut butter, apple Beverages: water  NUTRITION INTERVENTION  Nutrition education (E-1) on the following topics:   Initial Follow-up  [x]  []  Definition of Gestational Diabetes []  []  Why dietary management is important in controlling blood glucose [x]  []  Effects each nutrient has on blood glucose levels []  []  Simple  carbohydrates vs complex carbohydrates [x]  []  Fluid intake [x]  []  Creating a balanced meal plan [x]  []  Carbohydrate counting  [x]  []  When to check blood glucose levels [x]  []  Proper blood glucose monitoring techniques [x]  []  Effect of stress and stress reduction techniques  [x]  []  Exercise effect on blood glucose levels, appropriate exercise during pregnancy [x]  []  Importance of limiting caffeine and abstaining from alcohol and smoking [x]  []  Medications used for blood sugar control during pregnancy [x]  []  Hypoglycemia and rule of 15 [x]  []  Postpartum self care  Patient instructed to monitor glucose levels: FBS: 60 - ? 95 mg/dL (some clinics use 90 for cutoff) 1 hour: ? 140 mg/dL 2 hour: ? mg/dL  Patient received handouts: Nutrition Diabetes and Pregnancy Carbohydrate Counting List Planning Healthy Meals fold-out in Spanish  Patient will be seen for follow-up as needed.

## 2020-11-28 ENCOUNTER — Inpatient Hospital Stay (HOSPITAL_COMMUNITY)
Admission: AD | Admit: 2020-11-28 | Discharge: 2020-11-30 | DRG: 806 | Disposition: A | Discharge status: Home / Self Care | Payer: Medicaid Other | Attending: Obstetrics and Gynecology | Admitting: Obstetrics and Gynecology

## 2020-11-28 ENCOUNTER — Encounter (HOSPITAL_COMMUNITY): Payer: Self-pay | Admitting: Obstetrics and Gynecology

## 2020-11-28 DIAGNOSIS — O9081 Anemia of the puerperium: Secondary | ICD-10-CM | POA: Diagnosis not present

## 2020-11-28 DIAGNOSIS — Z23 Encounter for immunization: Secondary | ICD-10-CM | POA: Diagnosis not present

## 2020-11-28 DIAGNOSIS — O9912 Other diseases of the blood and blood-forming organs and certain disorders involving the immune mechanism complicating childbirth: Secondary | ICD-10-CM | POA: Diagnosis present

## 2020-11-28 DIAGNOSIS — O26893 Other specified pregnancy related conditions, third trimester: Secondary | ICD-10-CM | POA: Diagnosis present

## 2020-11-28 DIAGNOSIS — Z3A35 35 weeks gestation of pregnancy: Secondary | ICD-10-CM

## 2020-11-28 DIAGNOSIS — Z3490 Encounter for supervision of normal pregnancy, unspecified, unspecified trimester: Secondary | ICD-10-CM

## 2020-11-28 DIAGNOSIS — O0990 Supervision of high risk pregnancy, unspecified, unspecified trimester: Secondary | ICD-10-CM

## 2020-11-28 DIAGNOSIS — D62 Acute posthemorrhagic anemia: Secondary | ICD-10-CM | POA: Diagnosis not present

## 2020-11-28 DIAGNOSIS — D6959 Other secondary thrombocytopenia: Secondary | ICD-10-CM | POA: Diagnosis present

## 2020-11-28 DIAGNOSIS — O24420 Gestational diabetes mellitus in childbirth, diet controlled: Principal | ICD-10-CM | POA: Diagnosis present

## 2020-11-28 DIAGNOSIS — O4202 Full-term premature rupture of membranes, onset of labor within 24 hours of rupture: Secondary | ICD-10-CM

## 2020-11-28 DIAGNOSIS — Z20822 Contact with and (suspected) exposure to covid-19: Secondary | ICD-10-CM | POA: Diagnosis present

## 2020-11-28 DIAGNOSIS — O30043 Twin pregnancy, dichorionic/diamniotic, third trimester: Secondary | ICD-10-CM | POA: Diagnosis present

## 2020-11-28 DIAGNOSIS — O24415 Gestational diabetes mellitus in pregnancy, controlled by oral hypoglycemic drugs: Secondary | ICD-10-CM

## 2020-11-28 LAB — CBC
HCT: 38 % (ref 36.0–46.0)
Hemoglobin: 13.2 g/dL (ref 12.0–15.0)
MCH: 31.9 pg (ref 26.0–34.0)
MCHC: 34.7 g/dL (ref 30.0–36.0)
MCV: 91.8 fL (ref 80.0–100.0)
Platelets: 132 10*3/uL — ABNORMAL LOW (ref 150–400)
RBC: 4.14 MIL/uL (ref 3.87–5.11)
RDW: 13.4 % (ref 11.5–15.5)
WBC: 9.7 10*3/uL (ref 4.0–10.5)
nRBC: 0 % (ref 0.0–0.2)

## 2020-11-28 LAB — TYPE AND SCREEN
ABO/RH(D): A POS
Antibody Screen: NEGATIVE

## 2020-11-28 LAB — RESP PANEL BY RT-PCR (FLU A&B, COVID) ARPGX2
Influenza A by PCR: NEGATIVE
Influenza B by PCR: NEGATIVE
SARS Coronavirus 2 by RT PCR: NEGATIVE

## 2020-11-28 MED ORDER — ACETAMINOPHEN 325 MG PO TABS
650.0000 mg | ORAL_TABLET | ORAL | Status: DC | PRN
Start: 2020-11-28 — End: 2020-12-01

## 2020-11-28 MED ORDER — ONDANSETRON HCL 4 MG PO TABS: 4.0000 mg | ORAL_TABLET | ORAL | Status: DC | PRN

## 2020-11-28 MED ORDER — SENNOSIDES-DOCUSATE SODIUM 8.6-50 MG PO TABS
2.0000 | ORAL_TABLET | Freq: Every day | ORAL | Status: DC
Start: 2020-11-29 — End: 2020-12-01
  Administered 2020-11-29 – 2020-11-30 (×2): 2 via ORAL
  Filled 2020-11-28 (×2): qty 2

## 2020-11-28 MED ORDER — ACETAMINOPHEN 325 MG PO TABS: 650.0000 mg | ORAL_TABLET | ORAL | Status: DC | PRN

## 2020-11-28 MED ORDER — WITCH HAZEL-GLYCERIN EX PADS
1.0000 | MEDICATED_PAD | CUTANEOUS | Status: DC | PRN
Start: 2020-11-28 — End: 2020-12-01

## 2020-11-28 MED ORDER — LACTATED RINGERS IV SOLN: 500.0000 mL | INTRAVENOUS | Status: DC | PRN

## 2020-11-28 MED ORDER — TETANUS-DIPHTH-ACELL PERTUSSIS 5-2.5-18.5 LF-MCG/0.5 IM SUSY
0.5000 mL | PREFILLED_SYRINGE | Freq: Once | INTRAMUSCULAR | Status: AC
  Administered 2020-11-30: 0.5 mL via INTRAMUSCULAR
  Filled 2020-11-28: qty 0.5

## 2020-11-28 MED ORDER — COCONUT OIL OIL
1.0000 "application " | TOPICAL_OIL | Status: DC | PRN
  Administered 2020-11-29: 1 via TOPICAL

## 2020-11-28 MED ORDER — OXYCODONE-ACETAMINOPHEN 5-325 MG PO TABS
2.0000 | ORAL_TABLET | ORAL | Status: DC | PRN
Start: 2020-11-28 — End: 2020-11-28

## 2020-11-28 MED ORDER — DIPHENHYDRAMINE HCL 25 MG PO CAPS: 25.0000 mg | ORAL_CAPSULE | Freq: Four times a day (QID) | ORAL | Status: DC | PRN

## 2020-11-28 MED ORDER — LIDOCAINE HCL (PF) 1 % IJ SOLN
INTRAMUSCULAR | Status: AC
  Administered 2020-11-28: 30 mL
  Filled 2020-11-28: qty 30

## 2020-11-28 MED ORDER — OXYTOCIN-SODIUM CHLORIDE 30-0.9 UT/500ML-% IV SOLN: 2.5000 [IU]/h | INTRAVENOUS | Status: DC

## 2020-11-28 MED ORDER — LIDOCAINE HCL (PF) 1 % IJ SOLN: 30.0000 mL | INTRAMUSCULAR | Status: DC | PRN

## 2020-11-28 MED ORDER — IBUPROFEN 600 MG PO TABS
600.0000 mg | ORAL_TABLET | Freq: Four times a day (QID) | ORAL | Status: DC
  Administered 2020-11-28 – 2020-11-30 (×8): 600 mg via ORAL
  Filled 2020-11-28 (×8): qty 1

## 2020-11-28 MED ORDER — METHYLERGONOVINE MALEATE 0.2 MG/ML IJ SOLN
INTRAMUSCULAR | Status: AC
  Administered 2020-11-28: 0.2 mg
  Filled 2020-11-28: qty 1

## 2020-11-28 MED ORDER — LACTATED RINGERS IV SOLN: INTRAVENOUS | Status: DC

## 2020-11-28 MED ORDER — ONDANSETRON HCL 4 MG/2ML IJ SOLN: 4.0000 mg | INTRAMUSCULAR | Status: DC | PRN

## 2020-11-28 MED ORDER — FENTANYL CITRATE (PF) 100 MCG/2ML IJ SOLN
INTRAMUSCULAR | Status: AC
  Administered 2020-11-28: 100 ug
  Filled 2020-11-28: qty 2

## 2020-11-28 MED ORDER — SIMETHICONE 80 MG PO CHEW: 80.0000 mg | CHEWABLE_TABLET | ORAL | Status: DC | PRN

## 2020-11-28 MED ORDER — FLEET ENEMA 7-19 GM/118ML RE ENEM: 1.0000 | ENEMA | RECTAL | Status: DC | PRN

## 2020-11-28 MED ORDER — OXYTOCIN BOLUS FROM INFUSION
333.0000 mL | Freq: Once | INTRAVENOUS | Status: AC
  Administered 2020-11-28: 333 mL via INTRAVENOUS

## 2020-11-28 MED ORDER — OXYTOCIN-SODIUM CHLORIDE 30-0.9 UT/500ML-% IV SOLN
INTRAVENOUS | Status: AC
  Filled 2020-11-28: qty 500

## 2020-11-28 MED ORDER — BENZOCAINE-MENTHOL 20-0.5 % EX AERO: 1.0000 "application " | INHALATION_SPRAY | CUTANEOUS | Status: DC | PRN

## 2020-11-28 MED ORDER — OXYCODONE HCL 5 MG PO TABS: 5.0000 mg | ORAL_TABLET | ORAL | Status: DC | PRN

## 2020-11-28 MED ORDER — ONDANSETRON HCL 4 MG/2ML IJ SOLN: 4.0000 mg | Freq: Four times a day (QID) | INTRAMUSCULAR | Status: DC | PRN

## 2020-11-28 MED ORDER — DIBUCAINE (PERIANAL) 1 % EX OINT: 1.0000 "application " | TOPICAL_OINTMENT | CUTANEOUS | Status: DC | PRN

## 2020-11-28 MED ORDER — FENTANYL CITRATE (PF) 100 MCG/2ML IJ SOLN
100.0000 ug | Freq: Once | INTRAMUSCULAR | Status: DC
Start: 2020-11-28 — End: 2020-11-28

## 2020-11-28 MED ORDER — TRANEXAMIC ACID-NACL 1000-0.7 MG/100ML-% IV SOLN
INTRAVENOUS | Status: AC
  Administered 2020-11-28: 1000 mg
  Filled 2020-11-28: qty 100

## 2020-11-28 MED ORDER — OXYCODONE HCL 5 MG PO TABS: 10.0000 mg | ORAL_TABLET | ORAL | Status: DC | PRN

## 2020-11-28 MED ORDER — OXYCODONE-ACETAMINOPHEN 5-325 MG PO TABS: 1.0000 | ORAL_TABLET | ORAL | Status: DC | PRN

## 2020-11-28 MED ORDER — SOD CITRATE-CITRIC ACID 500-334 MG/5ML PO SOLN: 30.0000 mL | ORAL | Status: DC | PRN

## 2020-11-28 MED ORDER — PRENATAL MULTIVITAMIN CH
1.0000 | ORAL_TABLET | Freq: Every day | ORAL | Status: DC
  Administered 2020-11-29 – 2020-11-30 (×2): 1 via ORAL
  Filled 2020-11-28 (×2): qty 1

## 2020-11-28 NOTE — Lactation Note (Signed)
This note was copied from a baby's chart. Lactation Consultation Note  Patient Name: Shawna Erickson FXOVA'N Date: 11/28/2020 Reason for consult: L&D Initial assessment;Mother's request;Difficult latch;Late-preterm 34-36.6wks;Multiple gestation Age:35 hours  LC talked with parents with assistance of translator, Shawna Erickson in [place].   Baby B latches with increase in depth of swallows/ audible swallows noted for 23 minutes. ( Infant offered both breasts)  Baby A latched on shorter nipple and popping on and off. LC switched to opposite side latch due a few sucks for 6 mins popping on and off.  LC alerted RN baby A latch inconsistent and no audible swallows noted. Infant may require supplementation as he appears more tired and not as eager to feed.   Mom to receive further LC support on the floor. We did review feeding cues 8-12x 24 hr period, no more than 3 hrs without an attempt.   All questions answered at the end of the visit.   Maternal Data Has patient been taught Hand Expression?: Yes  Feeding Mother's Current Feeding Choice: Breast Milk  LATCH Score Latch: Repeated attempts needed to sustain latch, nipple held in mouth throughout feeding, stimulation needed to elicit sucking reflex.  Audible Swallowing: A few with stimulation  Type of Nipple: Everted at rest and after stimulation  Comfort (Breast/Nipple): Soft / non-tender  Hold (Positioning): Assistance needed to correctly position infant at breast and maintain latch.  LATCH Score: 7   Lactation Tools Discussed/Used    Interventions Interventions: Breast feeding basics reviewed;Assisted with latch;Skin to skin;Breast massage;Hand express;Breast compression;Adjust position;Support pillows;Position options;Expressed milk;Education  Discharge    Consult Status Consult Status: Follow-up from L&D Date: 11/29/20 Follow-up type: In-patient    Shawna Erickson  Shawna Erickson 11/28/2020, 7:10 PM

## 2020-11-28 NOTE — MAU Note (Signed)
.  Shawna Erickson is a 35 y.o. at [redacted]w[redacted]d here in MAU reporting: Patient complaining of ctx 1-2 minutes apart and SROM. Twin pregnancy. Checked by CNM, 9 cm, vtx/vtx by [place]. Raelyn Mora, CNM accompanied transport to L&D.

## 2020-11-28 NOTE — Discharge Summary (Signed)
Postpartum Discharge Summary     Patient Name: Shawna Erickson DOB: [birth year] MRN: [id number]  Date of admission: 11/28/2020 Delivery date:   Maryiah, Olvey [[id number]]  11/28/2020    Jalene, Demo [[id number]]  11/28/2020  Delivering provider:    Averill, Winters [[id number]]  [place], Weldon    Leyani, Gargus [[id number]]  Gaylan Gerold R  Date of discharge: 11/30/2020  Admitting diagnosis: Pregnancy [Z34.90] Intrauterine pregnancy: [redacted]w[redacted]d    Secondary diagnosis:  Active Problems:   Pregnancy   Anemia associated with acute blood loss  Additional problems: anemia   Discharge diagnosis: Preterm Pregnancy Delivered                                              Postpartum procedures: Venofer infusion Augmentation: AROM Complications: None  Hospital course: Onset of Labor With Vaginal Delivery      35y.o. yo [id number] 331w5das admitted in Active Labor on 11/28/2020. Patient had an uncomplicated labor course as follows:  Membrane Rupture Time/Date:    UgAlonia, Dibuono0[[id number]]4:00 PM    UgMarizol, Borror0[[id number]]5:15 PM ,   UgErikah, Thumm0[[id number]]11/28/2020    UgSheelah, Ritacco0[[id number]]11/28/2020   Delivery Method:   UgMarcelline Deist0[[id number]]Vaginal, Spontaneous    UgAntavia, Tandy0[[id number]]Vaginal, Spontaneous  Episiotomy:    UgRosario, Duey0[[id number]]None    UgAlfreida, Steffenhagen0[[id number]]None  Lacerations:     UgRadie, Berges0[[id number]]2nd degree    UgJoleene, Burnham0[[id number]]Perineal;2nd degree  Patient had an uncomplicated postpartum course.  She is ambulating, tolerating a regular diet, passing flatus, bowel movement since delivery, and urinating well. Patient is  discharged home in stable condition on 11/30/20.  Newborn Data: Birth date:   UgCamree, Wigington0[[id number]]11/28/2020    UgKashira, Behunin0[[id number]]11/28/2020  Birth time:   UgArmie, Moren0[[id number]]5:12 PM    UgMichaela Corner0[[id number]]5:33 PM  Gender:   UgCaylei, Sperry0[[id number]]Female    UgVanesa, Renier0[[id number]]Female  Living status:   UgJohnae, Friley0[[id number]]Living    UgKalana, Yust0[[id number]]Living  Apgars:   UgAthena, Baltz0[[id number]]9 [address]0[[id number]]9 ,   UgJaliah, Foody0[[id number]]9 [address][[id number]]9  Weight:   UgElizette, Shek0[[id number]]  9150    UgJaquaya, Coyle0[[id number]]2824 g   Magnesium Sulfate received: No BMZ received: No Rhophylac:N/A MMR:N/A T-DaP:Given prenatally Flu: No Transfusion:No  Physical exam  Vitals:   11/29/20 1341 11/29/20 2022 11/30/20 0533 11/30/20 1556  BP: 112/66 106/74 104/74 108/75  Pulse: 82 82 71 77  Resp:  18 18 17   Temp: 98.5 F (36.9 C) 98.4 F (36.9 C) 98 F (36.7 C) 98.3 F (36.8 C)  TempSrc: Oral Oral Oral Oral  SpO2: 99%  100%    General: alert, cooperative, and no distress Lochia: appropriate Uterine Fundus: firm Incision: N/A DVT Evaluation: No evidence of DVT seen on physical exam. No cords or calf tenderness. No significant calf/ankle edema. Labs: Lab Results  Component Value Date  WBC 13.2 (H) 11/30/2020   HGB 8.3 (L) 11/30/2020   HCT 24.7 (L) 11/30/2020   MCV 93.9 11/30/2020   PLT 96 (L) 11/30/2020   CMP Latest Ref Rng & Units 11/30/2020  Glucose 70 - 99 mg/dL 83  BUN 6 - 20 mg/dL 13  Creatinine 0.44 - 1.00 mg/dL 0.93  Sodium 135 - 145 mmol/L 137  Potassium 3.5 - 5.1  mmol/L 4.5  Chloride 98 - 111 mmol/L 106  CO2 22 - 32 mmol/L 25  Calcium 8.9 - 10.3 mg/dL 8.1(L)  Total Protein 6.5 - 8.1 g/dL 4.8(L)  Total Bilirubin 0.3 - 1.2 mg/dL 0.3  Alkaline Phos 38 - 126 U/L 127(H)  AST 15 - 41 U/L 24  ALT 0 - 44 U/L 12   Edinburgh Score: Edinburgh Postnatal Depression Scale Screening Tool 11/29/2020  I have been able to laugh and see the funny side of things. 0  I have looked forward with enjoyment to things. 0  I have blamed myself unnecessarily when things went wrong. 0  I have been anxious or worried for no good reason. 0  I have felt scared or panicky for no good reason. 0  Things have been getting on top of me. 1  I have been so unhappy that I have had difficulty sleeping. 0  I have felt sad or miserable. 0  I have been so unhappy that I have been crying. 0  The thought of harming myself has occurred to me. 0  Edinburgh Postnatal Depression Scale Total 1   After visit meds:  Allergies as of 11/30/2020   No Known Allergies      Medication List     TAKE these medications    ferrous sulfate 325 (65 FE) MG tablet Commonly known as: FerrouSul Take 1 tablet (325 mg total) by mouth every other day.   ibuprofen 600 MG tablet Commonly known as: ADVIL Take 1 tablet (600 mg total) by mouth every 6 (six) hours.   Prenatal Vitamin 27-0.8 MG Tabs Take 1 tablet by mouth daily.        Discharge home in stable condition Infants Feeding: Bottle and Breast Infants Disposition:home with mother Discharge instruction: per After Visit Summary and Postpartum booklet. Activity: Advance as tolerated. Pelvic rest for 6 weeks.  Diet: carb modified diet Future Appointments: Future Appointments  Date Time Provider [place]  12/03/2020  7:15 AM [facility] NURSE [facility]  12/03/2020  7:30 AM [facility] US3 [facility]  12/11/2020  9:15 AM [facility] NST [facility]  12/18/2020  9:15 AM [facility] NST [facility] [place]   Follow up Visit at [facility]: Message sent  11/28/20 Please schedule this patient for a In person postpartum visit in 6 weeks with the following provider: Any provider. Additional Postpartum F/U:2 hour GTT  High risk pregnancy complicated by: GDM and twins Delivery mode:     Acelynn, Dejonge [[id number]]  Vaginal, Spontaneous    Michale, Weikel [[id number]]  Vaginal, Spontaneous  Anticipated Birth Control:  OCPs  Second message sent on day of discharge for in-office labs to be drawn for platelets on Thursday this week for STAT CBC to check on platelets. Per consultation with Dr. Elgie Congo, pt OK to be discharged home at this time with office follow-up.  11/30/2020 Clarisa Fling, NP

## 2020-11-28 NOTE — Plan of Care (Signed)
A.Cayla Wiegand, RN 

## 2020-11-28 NOTE — Progress Notes (Signed)
Upon start of shift, Baby A & B both had low temperatures while skin to skin; both requiring multiple interventions to maintain temp within normal range. In house Neo-NP and NAN notified for additional assessment. NP stated both baby A & B appropriate to go with Mom to 520 on Mother Baby Unit.

## 2020-11-28 NOTE — H&P (Signed)
Shawna Erickson is a 35 y.o. [phone] female at [redacted]w[redacted]d with di-di twins presenting for SROM with SOL. She came into MAU with a strong urge to push and grossly ruptured. Moved swiftly to L&D where she delivered Twin A within of admission. OB History     Gravida  3   Para  3   Term  2   Preterm  1   AB  0   Living  4      SAB  0   IAB  0   Ectopic  0   Multiple  1   Live Births  4          Past Medical History:  Diagnosis Date   Chicken pox    Depression    Urinary tract infection    Past Surgical History:  Procedure Laterality Date   NO PAST SURGERIES     Family History: family history includes Hypertension in her mother. Social History:  reports that she has never smoked. She has never used smokeless tobacco. She reports that she does not drink alcohol and does not use drugs.    Maternal Diabetes: Yes:  Diabetes Type:  Diet controlled Genetic Screening: Normal Maternal Ultrasounds/Referrals: Normal Fetal Ultrasounds or other Referrals:  None Maternal Substance Abuse:  No Significant Maternal Medications:  None Significant Maternal Lab Results:  Other: GBS unknown Other Comments:  None  Pertinent items noted in HPI and remainder of comprehensive ROS otherwise negative.  Maternal Medical History:  Reason for admission: Rupture of membranes and contractions.   Contractions: Onset was less than 1 hour ago.   Frequency: regular.   Duration is approximately 1 minute.   Perceived severity is strong.   Fetal activity: Perceived fetal activity is normal.   Last perceived fetal movement was within the past hour.   Prenatal complications: no prenatal complications Prenatal Complications - Diabetes: gestational. Diabetes is managed by diet.    Dilation: 10 Effacement (%): 100 Station: 0 Exam by:: Jem Castro, CNM Blood pressure 139/71, pulse 91, resp. rate 16, last menstrual period 03/23/2020, unknown if currently breastfeeding. Maternal Exam:   Uterine Assessment: Contraction strength is firm.  Contraction duration is 1 minute. Contraction frequency is regular.  Abdomen: Patient reports no abdominal tenderness. Fetal presentation: vertex Introitus: Normal vulva. Normal vagina.  Ferning test: not done.  Nitrazine test: not done. Amniotic fluid character: clear. Pelvis: adequate for delivery.   Cervix: Cervix evaluated by digital exam.     Fetal Exam Fetal Monitor Review: Mode: ultrasound.   Baseline rate: 135.  Pattern: accelerations present and no decelerations.   Baby A delivered prior to getting him on the monitor. Fetal State Assessment: Category I - tracings are normal.  Physical Exam Vitals and nursing note reviewed.  Constitutional:      Appearance: Normal appearance.  HENT:     Head: Normocephalic and atraumatic.     Mouth/Throat:     Mouth: Mucous membranes are moist.  Eyes:     Pupils: Pupils are equal, round, and reactive to light.  Cardiovascular:     Rate and Rhythm: Normal rate.     Pulses: Normal pulses.  Pulmonary:     Effort: Pulmonary effort is normal.  Abdominal:     Tenderness: There is no abdominal tenderness.  Genitourinary:    General: Normal vulva.  Musculoskeletal:        General: Normal range of motion.  Skin:    General: Skin is warm.     Capillary Refill: Capillary  refill takes less than 2 seconds.  Neurological:     Mental Status: She is alert and oriented to person, place, and time.  Psychiatric:        Mood and Affect: Mood normal.        Behavior: Behavior normal.        Thought Content: Thought content normal.        Judgment: Judgment normal.    Prenatal labs: ABO, Rh: --/--/A POS (09/30 1747) Antibody: NEG (09/30 1747) Rubella: 2.50 (08/04 1018) RPR: Non Reactive (08/04 1018)  HBsAg: Negative (08/04 1018)  HIV: Non Reactive (08/04 1018)  GBS:   unknown  Assessment/Plan: [id number] at [redacted]w[redacted]d for SOL w SROM Admit to L&D for expectant management of precipitous  delivery  Bernerd Limbo 11/28/2020, 7:41 PM

## 2020-11-29 LAB — CBC
HCT: 29 % — ABNORMAL LOW (ref 36.0–46.0)
Hemoglobin: 10 g/dL — ABNORMAL LOW (ref 12.0–15.0)
MCH: 31.8 pg (ref 26.0–34.0)
MCHC: 34.5 g/dL (ref 30.0–36.0)
MCV: 92.4 fL (ref 80.0–100.0)
Platelets: 97 10*3/uL — ABNORMAL LOW (ref 150–400)
RBC: 3.14 MIL/uL — ABNORMAL LOW (ref 3.87–5.11)
RDW: 13.2 % (ref 11.5–15.5)
WBC: 14 10*3/uL — ABNORMAL HIGH (ref 4.0–10.5)
nRBC: 0 % (ref 0.0–0.2)

## 2020-11-29 LAB — RPR: RPR Ser Ql: NONREACTIVE

## 2020-11-29 NOTE — Progress Notes (Signed)
POSTPARTUM PROGRESS NOTE  Post Partum Day 1  Subjective:  Shawna Erickson is a 35 y.o. [id number] s/p VD x2 yesterday afternoon at [redacted]w[redacted]d.  She reports she is doing well. No acute events overnight. She denies any problems with ambulating, voiding or po intake. Denies nausea or vomiting.  Pain is moderately controlled.  Lochia is mild.  Objective: Blood pressure 122/75, pulse 87, temperature 98.5 F (36.9 C), temperature source Oral, resp. rate 17, last menstrual period 03/23/2020, SpO2 100 %, unknown if currently breastfeeding.  Physical Exam:  General: alert, cooperative and no distress Chest: no respiratory distress Heart:regular rate, distal pulses intact Uterine Fundus: firm, appropriately tender DVT Evaluation: No calf swelling or tenderness Extremities: minimal edema Skin: warm, dry  Recent Labs    11/28/20 1747 11/29/20 0517  HGB 13.2 10.0*  HCT 38.0 29.0*    Assessment/Plan: Shawna Erickson is a 35 y.o. [id number] s/p VD (cephalic x2) at [redacted]w[redacted]d   PPD#1 - Doing well  Routine postpartum care  Gestational thrombocytopenia: 132 on admit, 38 today. Will repeat tomorrow am.   A1GDM: Fasting CBG tomorrow am.   Late and limited PNC: SW consult placed.   Contraception: OCPs vs POPs Feeding: breast/bottle  Dispo: Plan for discharge tomorrow.   LOS: 1 day   Leticia Penna, DO  OB Fellow  11/29/2020, 8:05 AM

## 2020-11-29 NOTE — Lactation Note (Signed)
This note was copied from a baby's chart. Lactation Consultation Note  Patient Name: Shawna Erickson HBZJI'R Date: 11/29/2020 Reason for consult: Follow-up assessment;Infant < 6lbs;Late-preterm 34-36.6wks Age:34 hours   P4 mother whose infant twins are now 46 hours old.  These are later preterm babies at 35+5 weeks with a CGA of 35+6 weeks.  Mother's current feeding plan is breast/formula.  Mother speaks primarily Spanish, however, father is bilingual and desires to translate for mother.    Offered to assist with waking and latching babies; mother interested.  Baby A "Alecia Lemming"  has had more difficulty than Baby B "Angelina."  Reviewed the LPTI policy with parents.  Discussed the feeding plan for today stressing the importance of feeding at least every three hours and reaching acceptable volumes with supplementation.    Reviewed hand expression; mother unable to express colostrum at this time.  Awakened "Alecia Lemming" and assisted to latch.  With constant stimulation he was able to suck intermittently for 5 minutes.  After this time, father fed Similac 22 calorie formula.  Showed him how to pace bottle feed.  Father has the yellow nipple since the extra slow flow nipples are unavailable.  Burped well.  Awakened "Edson Snowball" next and assisted to latch.  She was alert and sucked well for 8 minutes before becoming too sleepy to continue.  Father fed her 10 mls of Similac 22 calorie formula using the yellow nipple.  Set mother up with the DEBP; reviewed pump assembly and cleaning.  #24 flanges are appropriate at this time. Mother aware that she may need to increase to the #27 flanges eventually.  Mother was not able to obtain colostrum after pumping for 15 minutes; reassurance given.    Praised parents for working well together.  Offered to return for any further questions/concerns.    Mother is a [facility] participant in [place].  Referral faxed with parents permission.  Mother is planning  on obtaining a DEBP for home use from [facility].  RN updated.     Maternal Data Has patient been taught Hand Expression?: Yes Does the patient have breastfeeding experience prior to this delivery?: Yes How long did the patient breastfeed?: 3 years with first child and 2 years with second child  Feeding Mother's Current Feeding Choice: Breast Milk and Formula  LATCH Score Latch: Repeated attempts needed to sustain latch, nipple held in mouth throughout feeding, stimulation needed to elicit sucking reflex.  Audible Swallowing: None  Type of Nipple: Everted at rest and after stimulation  Comfort (Breast/Nipple): Soft / non-tender  Hold (Positioning): Assistance needed to correctly position infant at breast and maintain latch.  LATCH Score: 6   Lactation Tools Discussed/Used Tools: Pump;Flanges;Coconut oil Flange Size: 24 Breast pump type: Double-Electric Breast Pump;Manual Pump Education: Setup, frequency, and cleaning;Milk Storage Reason for Pumping: Breast stimulation for supplementation Pumping frequency: Every three hours  Interventions Interventions: Assisted with latch;Breast feeding basics reviewed;Hand express;Breast massage;Skin to skin;Breast compression;Coconut oil;Position options;Support pillows;Adjust position;DEBP;Education;LC Services brochure  Discharge Pump: DEBP;Manual (Desires a WIC loaner) WIC Program: Yes  Consult Status Consult Status: Follow-up Date: 11/30/20 Follow-up type: In-patient    Dora Sims 11/29/2020, 7:31 AM

## 2020-11-30 ENCOUNTER — Other Ambulatory Visit: Payer: Self-pay

## 2020-11-30 ENCOUNTER — Encounter (HOSPITAL_COMMUNITY): Payer: Self-pay | Admitting: Obstetrics and Gynecology

## 2020-11-30 DIAGNOSIS — D62 Acute posthemorrhagic anemia: Secondary | ICD-10-CM | POA: Diagnosis not present

## 2020-11-30 LAB — GLUCOSE, CAPILLARY: Glucose-Capillary: 70 mg/dL (ref 70–99)

## 2020-11-30 LAB — CBC WITH DIFFERENTIAL/PLATELET
Abs Immature Granulocytes: 0.23 10*3/uL — ABNORMAL HIGH (ref 0.00–0.07)
Basophils Absolute: 0.1 10*3/uL (ref 0.0–0.1)
Basophils Relative: 0 %
Eosinophils Absolute: 0.1 10*3/uL (ref 0.0–0.5)
Eosinophils Relative: 1 %
HCT: 24.7 % — ABNORMAL LOW (ref 36.0–46.0)
Hemoglobin: 8.3 g/dL — ABNORMAL LOW (ref 12.0–15.0)
Immature Granulocytes: 2 %
Lymphocytes Relative: 28 %
Lymphs Abs: 3.7 10*3/uL (ref 0.7–4.0)
MCH: 31.6 pg (ref 26.0–34.0)
MCHC: 33.6 g/dL (ref 30.0–36.0)
MCV: 93.9 fL (ref 80.0–100.0)
Monocytes Absolute: 0.7 10*3/uL (ref 0.1–1.0)
Monocytes Relative: 5 %
Neutro Abs: 8.5 10*3/uL — ABNORMAL HIGH (ref 1.7–7.7)
Neutrophils Relative %: 64 %
Platelets: 96 10*3/uL — ABNORMAL LOW (ref 150–400)
RBC: 2.63 MIL/uL — ABNORMAL LOW (ref 3.87–5.11)
RDW: 13.8 % (ref 11.5–15.5)
WBC: 13.2 10*3/uL — ABNORMAL HIGH (ref 4.0–10.5)
nRBC: 0 % (ref 0.0–0.2)

## 2020-11-30 LAB — COMPREHENSIVE METABOLIC PANEL
ALT: 12 U/L (ref 0–44)
AST: 24 U/L (ref 15–41)
Albumin: 1.9 g/dL — ABNORMAL LOW (ref 3.5–5.0)
Alkaline Phosphatase: 127 U/L — ABNORMAL HIGH (ref 38–126)
Anion gap: 6 (ref 5–15)
BUN: 13 mg/dL (ref 6–20)
CO2: 25 mmol/L (ref 22–32)
Calcium: 8.1 mg/dL — ABNORMAL LOW (ref 8.9–10.3)
Chloride: 106 mmol/L (ref 98–111)
Creatinine, Ser: 0.93 mg/dL (ref 0.44–1.00)
GFR, Estimated: >60 mL/min (ref >60)
Glucose, Bld: 83 mg/dL (ref 70–99)
Potassium: 4.5 mmol/L (ref 3.5–5.1)
Sodium: 137 mmol/L (ref 135–145)
Total Bilirubin: 0.3 mg/dL (ref 0.3–1.2)
Total Protein: 4.8 g/dL — ABNORMAL LOW (ref 6.5–8.1)

## 2020-11-30 MED ORDER — SODIUM CHLORIDE 0.9 % IV SOLN: 300.0000 mg | Freq: Once | INTRAVENOUS | Status: DC

## 2020-11-30 MED ORDER — FERROUS SULFATE 325 (65 FE) MG PO TABS: 325.0000 mg | ORAL_TABLET | ORAL | 0 refills | Status: AC

## 2020-11-30 MED ORDER — SODIUM CHLORIDE 0.9 % IV SOLN
500.0000 mg | Freq: Once | INTRAVENOUS | Status: AC
  Administered 2020-11-30: 500 mg via INTRAVENOUS
  Filled 2020-11-30: qty 25

## 2020-11-30 MED ORDER — IBUPROFEN 600 MG PO TABS: 600.0000 mg | ORAL_TABLET | Freq: Four times a day (QID) | ORAL | 0 refills | Status: AC

## 2020-11-30 NOTE — Lactation Note (Addendum)
This note was copied from a baby's chart. Lactation Consultation Note  Patient Name: Shawna Erickson [id number] Date: 11/30/2020 Reason for consult: Follow-up assessment;Late-preterm 34-36.6wks Age:35 hours Consult was done in Spanish:  Follow up visit to 46 hours old twins. Per mother, both infants have been been having good voids and stools. Mother is pumping but unable to collect any colostrum. Discussed flange size, fitted 27-mm flange and encouraged using coconut/olive oil to flange prior to pump. Demonstrated how to use hand pump, nipple everts completely and colostrum is present. Encouraged to use hand pump prior to latching.   Demonstrated pacing, upright position and frequent burping. Reviewed appropriate feeding volume according to age.   [name]: FOB formula-fed ~21 mL using  slow flow nipple.   [name]: LC formula-fed ~25 mL using magenta extra slow flow nipple   Plan:   1-Feeding on demand 2-Preserve infant energy limiting feeding sessions to 30 min max.  3-Use pump for nipple eversion and/or supplementation purposes. 4-Promoted maternal hydration, nutrition and rest.    All questions answered at this time.   Maternal Data Does the patient have breastfeeding experience prior to this delivery?: Yes How long did the patient breastfeed?: 16 months x2  Feeding Mother's Current Feeding Choice: Breast Milk and Formula Nipple Type: Slow - flow  Lactation Tools Discussed/Used Tools: Pump;Flanges Flange Size: 27 Breast pump type: Double-Electric Breast Pump;Manual Pump Education: Setup, frequency, and cleaning;Milk Storage Reason for Pumping: LPTI Pumping frequency: every 3h  Interventions Interventions: Breast feeding basics reviewed;Skin to skin;Hand express;Breast massage;DEBP;Hand pump;Expressed milk;Education;Pace feeding;LC Services brochure  Discharge Discharge Education: Engorgement and breast care;Warning signs for feeding baby Pump: Manual;DEBP WIC  Program: Yes  Consult Status Consult Status: Complete Date: 11/30/20 Follow-up type: Call as needed    Shawna Erickson A Higuera Shawna Erickson 11/30/2020, 3:06 PM

## 2020-11-30 NOTE — Progress Notes (Signed)
All discharge teaching completed with interpreter. MOB and FOB state understanding of home care needs and follow up for tomorrow with Pediatrician and WIC and then follow up with OB.

## 2020-11-30 NOTE — Clinical Social Work Maternal (Signed)
CLINICAL SOCIAL WORK MATERNAL/CHILD NOTE  Patient Details  Name: Shawna Erickson MRN: [id number] Date of Birth: 08/05/1985  Date:  11/30/2020  Clinical Social Worker Initiating Note:  Kealie Barrie, MSW, LCSWA Date/Time: Initiated:  11/30/20/1224     Child's Name:  Shawna Erickson, Shawna Erickson   Biological Parents:  Mother, Father   Need for Interpreter:  Spanish   Reason for Referral:  Behavioral Health Concerns, Late or No Prenatal Care     Address:  [address] [place]  [address]    Phone number:  [phone] (home)     Additional phone number:   Household Members/Support Persons (HM/SP):   Household Member/Support Person 1, Household Member/Support Person 2, Household Member/Support Person 3   HM/SP Name Relationship DOB or Age  HM/SP -1 Jesus Erickson FOB 09/22/77  HM/SP -2 Jesus Erickson Son 01/07/05  HM/SP -3 Jacklyn Erickson Daughter 12/26/08  HM/SP -4        HM/SP -5        HM/SP -6        HM/SP -7        HM/SP -8          Natural Supports (not living in the home):  Spouse/significant other   Professional Supports: None   Employment: Unemployed   Type of Work:     Education:  Other (comment) (middle school)   Homebound arranged:    Financial Resources:  Self-Pay     Other Resources:  WIC   Cultural/Religious Considerations Which May Impact Care:    Strengths:  Ability to meet basic needs  , Home prepared for child  , Pediatrician chosen   Psychotropic Medications:         Pediatrician:    [place] (including [place])  Pediatrician List:   [place]    [place]    [place]    [place]    [place]    [place] Other ([facility])    Pediatrician Fax Number:    Risk Factors/Current Problems:  Mental Health Concerns  , Other (Comment) (late/limited PNC)   Cognitive State:  Able to Concentrate  , Alert  , Insightful  , Linear  Thinking     Mood/Affect:  Comfortable  , Interested  , Bright  , Relaxed  , Calm  , Happy     CSW Assessment: CSW met with MOB to complete consult for history of depression, and late/limited prenatal care. CSW observed MOB resting in bed bonding with infant #1, and FOB in recliner bonding with infant #2. Via on-site translator, MOB gave CSW verbal consent to complete consult while FOB was present. CSW explained role, and reason for consult. MOB was pleasant, and polite during engagement with CSW. MOB reported, history of depression during her second pregnancy (2010). MOB denied any history of psychotropic medication. MOB reported, she did not have insurance, and that is the reason for late/limited prenatal care.   CSW inform MOB of Drug Screen Policy, and MOB was understanding of protocol. CSW will continue to follow the CDS, and will make CPS report if warranted.   CSW provided education regarding the baby blues period vs. perinatal mood disorders, discussed treatment and gave resources for mental health follow up if concerns arise. CSW recommends self- evaluation during the postpartum time period using the New Mom Checklist from Postpartum Progress and encouraged MOB to contact a medical professional if symptoms are noted at any time.   MOB reported,   since delivery she feels, "fine". MOB reported, FOB is very supportive. MOB denied SI, and HI when CSW assessed for safety.   MOB reported, she receives WIC, but does not receive food stamps. CSW encourage MOB to complete food stamp application for additional support. MOB reported, infant's pediatrician will be at [facility], and there are no barriers to follow up infant's care. MOB reported, she has all essentials needed to care for infant. MOB reported, infant has a car seat, crib, and she plan to purchase a bassinet. MOB denied any additional barriers.     CSW provided education on sudden infant death syndrome  (SIDS).  CSW e-mail Iris Carter, Financial Navigator for medicaid assistance.  CSW inform Karen, RN the need for CDS order.   CSW will continue to follow the CDS, and will make CPS report if warranted.   CSW Plan/Description:  No Further Intervention Required/No Barriers to Discharge, Sudden Infant Death Syndrome (SIDS) Education, Perinatal Mood and Anxiety Disorder (PMADs) Education, Hospital Drug Screen Policy Information, CSW Will Continue to Monitor Umbilical Cord Tissue Drug Screen Results and Make Report if Warranted    Eddi Hymes, LCSWA 11/30/2020, 12:31 PM  Omeed Osuna, MSW, LCSW-A Clinical Social Worker- Weekends [phone]  

## 2020-12-01 ENCOUNTER — Telehealth: Payer: Self-pay | Admitting: General Practice

## 2020-12-01 NOTE — Telephone Encounter (Signed)
Patient called into front office requesting a callback from a nurse regarding swollen feet.   Called patient with Raquel assisting for spanish interpretation. Patient states she noticed this morning her feet were swollen and wasn't sure if this was concerning. She states her feet were swollen during her pregnancy but went away after delivery until this morning. Patient states the swelling isn't very significant and she denies headaches, dizziness or blurry vision. Reassured patient of normal swelling. Reviewed iron prescription information and importance of taking. Patient verbalized understanding to all.

## 2020-12-02 LAB — SURGICAL PATHOLOGY

## 2020-12-03 ENCOUNTER — Ambulatory Visit: Payer: Self-pay

## 2020-12-09 ENCOUNTER — Telehealth (HOSPITAL_COMMUNITY): Payer: Self-pay | Admitting: *Deleted

## 2020-12-09 NOTE — Telephone Encounter (Signed)
Hospital discharge follow-up call completed with Language Line Interpreter [phone]. Patient voiced no questions or concerns regarding her own health. EPDS = 0. Patient asked how long she should continue her iron - RN instructed patient to continue medication until her follow-up appointment with her OB. Patient voiced no questions or concerns regarding babies at this time. Patient reported infants sleep together in a crib during the day, and in separate cribs at night. Patient reported that both infants sleep on their backs. RN reviewed ABCs of safe sleep - patient verbalized understanding. Deforest Hoyles, RN, 12/09/20, 1944.

## 2020-12-11 ENCOUNTER — Other Ambulatory Visit: Payer: Self-pay

## 2020-12-18 ENCOUNTER — Other Ambulatory Visit: Payer: Self-pay

## 2020-12-31 ENCOUNTER — Ambulatory Visit: Payer: Self-pay | Admitting: Family Medicine

## 2020-12-31 ENCOUNTER — Other Ambulatory Visit: Payer: Self-pay

## 2021-01-06 ENCOUNTER — Other Ambulatory Visit: Payer: Self-pay | Admitting: *Deleted

## 2021-01-06 DIAGNOSIS — O0990 Supervision of high risk pregnancy, unspecified, unspecified trimester: Secondary | ICD-10-CM

## 2021-01-15 ENCOUNTER — Other Ambulatory Visit: Payer: Self-pay

## 2021-01-15 ENCOUNTER — Encounter: Payer: Self-pay | Admitting: Obstetrics and Gynecology

## 2021-01-15 ENCOUNTER — Ambulatory Visit (INDEPENDENT_AMBULATORY_CARE_PROVIDER_SITE_OTHER): Payer: Medicaid Other | Admitting: Obstetrics and Gynecology

## 2021-01-15 DIAGNOSIS — O24429 Gestational diabetes mellitus in childbirth, unspecified control: Secondary | ICD-10-CM

## 2021-01-15 DIAGNOSIS — O0990 Supervision of high risk pregnancy, unspecified, unspecified trimester: Secondary | ICD-10-CM

## 2021-01-15 MED ORDER — NORETHINDRONE 0.35 MG PO TABS: 1.0000 | ORAL_TABLET | Freq: Every day | ORAL | 11 refills | Status: DC

## 2021-01-15 NOTE — Addendum Note (Signed)
Addended by: Jill Side on: 01/15/2021 09:50 AM   Modules accepted: Orders

## 2021-01-15 NOTE — Progress Notes (Signed)
Provider location: Center for [facility] [employer] [facility]   Patient location: Home  I connected withNAME@ on 01/15/21 at 10:15 AM EST by Mychart Video Encounter and verified that I am speaking with the correct person using two identifiers.       I discussed the limitations, risks, security and privacy concerns of performing an evaluation and management service virtually and the availability of in person appointments. I also discussed with the patient that there may be a patient responsible charge related to this service. The patient expressed understanding and agreed to proceed.  Post Partum Visit Note Subjective:   Shawna Erickson is a 35 y.o. G23P2104 female who presents for a postpartum visit. She is 6 weeks and 5 days postpartum following a normal spontaneous vaginal delivery.  I have fully reviewed the prenatal and intrapartum course. The delivery was at 35 and 5 gestational weeks.  Anesthesia: none. Postpartum course has been uncomplicated. Baby is doing well. Baby is feeding by both breast and bottle - Similac Advance. Bleeding no bleeding. Bowel function is normal. Bladder function is normal. Patient is not sexually active. Contraception method is none. Postpartum depression screening: negative.   The pregnancy intention screening data noted above was reviewed. Potential methods of contraception were discussed. The patient elected to proceed with No data recorded.       Review of Systems Pertinent items noted in HPI and remainder of comprehensive ROS otherwise negative.  Objective:  BP 116/82   Pulse 65   Wt 137 lb 3.2 oz (62.2 kg)   Breastfeeding Yes   BMI 23.55 kg/m     General:  Alert, oriented and cooperative. Patient is in no acute distress.  Respiratory: Normal respiratory effort, no problems with respiration noted  Mental Status: Normal mood and affect. Normal behavior. Normal judgment and thought content.  Rest of physical exam deferred due  to type of encounter   Assessment:    Normal postpartum exam.  Plan:  Essential components of care per ACOG recommendations:  1.  Mood and well being: Patient with negative depression screening today. Reviewed local resources for support.  - Patient does not use tobacco.  - hx of drug use? No    2. Infant care and feeding:  -Patient currently breastmilk feeding? Yes If breastmilk feeding discussed return to work and pumping. If needed, patient was provided letter for work to allow for every 2-3 hr pumping breaks, and to be granted a private location to express breastmilk and refrigerated area to store breastmilk. Reviewed importance of draining breast regularly to support lactation. -Social determinants of health (SDOH) reviewed in EPIC. No concerns  3. Sexuality, contraception and birth spacing - Patient does not want a pregnancy in the next year.  Desired family size is 4 children.  - Reviewed forms of contraception in tiered fashion. Patient desired oral progesterone-only contraceptive today.   - Discussed birth spacing of 18 months  4. Sleep and fatigue -Encouraged family/partner/community support of 4 hrs of uninterrupted sleep to help with mood and fatigue  5. Physical Recovery  - Discussed patients delivery and complications - Patient had a first degree laceration, perineal healing reviewed. Patient expressed understanding - Patient has urinary incontinence? No  - Patient is safe to resume physical and sexual activity  6.  Health Maintenance - Last pap smear done 09/2020 and was normal with negative HPV.   7. Chronic Disease - PCP follow up  I provided 15 minutes of face-to-face time during this encounter.  Return in 1 year (on 01/15/2022).  No future appointments.  Catalina Antigua, MD Center for [employer], [facility]

## 2021-01-16 LAB — GLUCOSE TOLERANCE, 2 HOURS
Glucose, 2 hour: 88 mg/dL (ref 70–139)
Glucose, GTT - Fasting: 80 mg/dL (ref 70–99)

## 2021-01-19 ENCOUNTER — Telehealth: Payer: Self-pay | Admitting: General Practice

## 2021-01-19 DIAGNOSIS — Z30011 Encounter for initial prescription of contraceptive pills: Secondary | ICD-10-CM

## 2021-01-19 MED ORDER — NORETHINDRONE 0.35 MG PO TABS: 1.0000 | ORAL_TABLET | Freq: Every day | ORAL | 11 refills | Status: AC

## 2021-01-19 NOTE — Telephone Encounter (Signed)
-----   Message from Shawna Antigua, MD sent at 01/19/2021  9:00 AM EST ----- PLease inform patient that she passed her diabetes test. A healthy lifestyle which includes a regular exercise regimen and a good diet will help her hopefully avoid diabetes in her future  Thanks  Dr. Jolayne Panther

## 2021-01-19 NOTE — Telephone Encounter (Signed)
Called patient with Shawna Erickson assisting with spanish interpretation and informed her of results/recommendations. Patient verbalized understanding and states when she was seen last week a prescription for birth control pills was supposed to be sent in but nothing is at her pharmacy. Per chart review, transmission to pharmacy failed. Rx resubmitted & patient informed. Patient verbalized understanding.

## 2021-10-11 ENCOUNTER — Other Ambulatory Visit: Payer: Self-pay | Admitting: Obstetrics and Gynecology

## 2021-10-11 DIAGNOSIS — O0990 Supervision of high risk pregnancy, unspecified, unspecified trimester: Secondary | ICD-10-CM

## 2022-05-15 IMAGING — US US MFM OB DETAIL+14 WK
1 series · 13 of 28 positions shown · non-contrast
Comparison: none

[Series 1: us mfm ob detail+14 wk · 146 acquisitions, 13 frames shown]
[im 6/146]
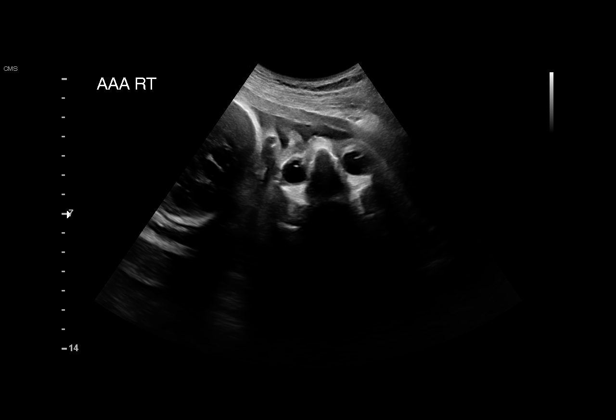
[im 17/146]
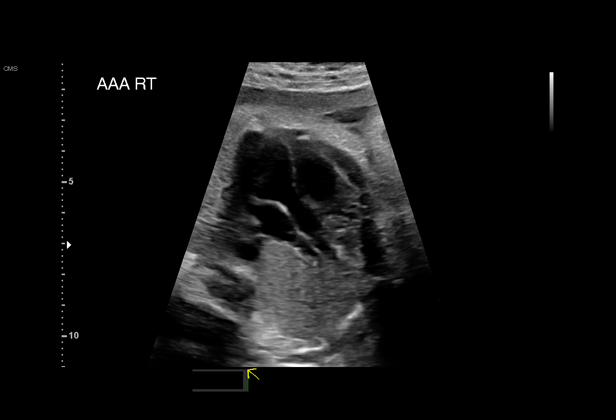
[im 27/146]
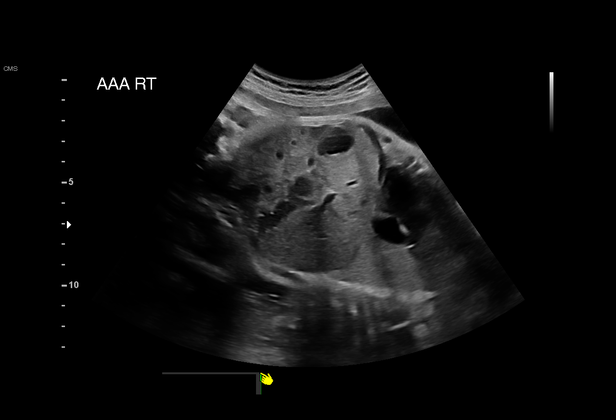
[im 38/146]
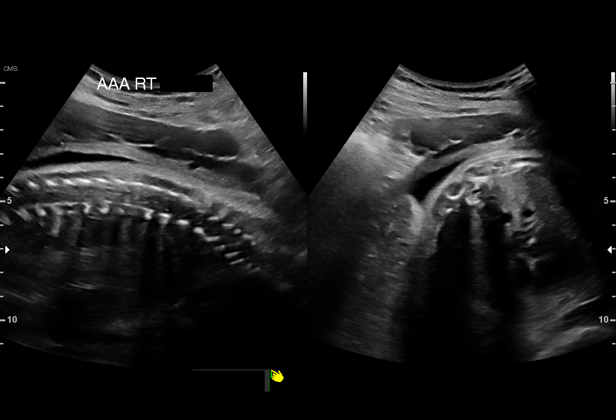
[im 49/146]
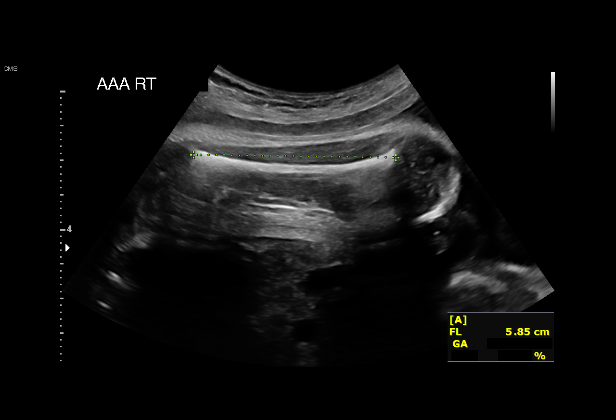
[im 60/146]
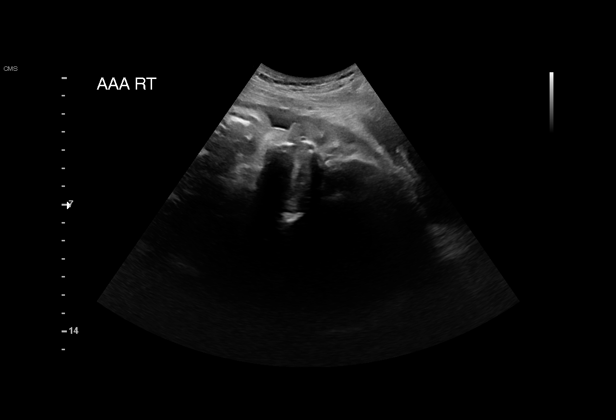
[im 76/146]
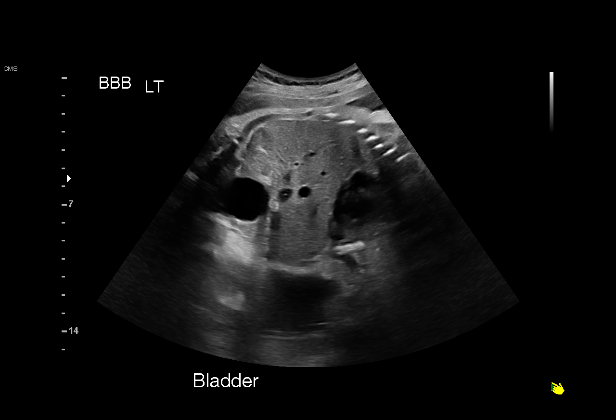
[im 86/146]
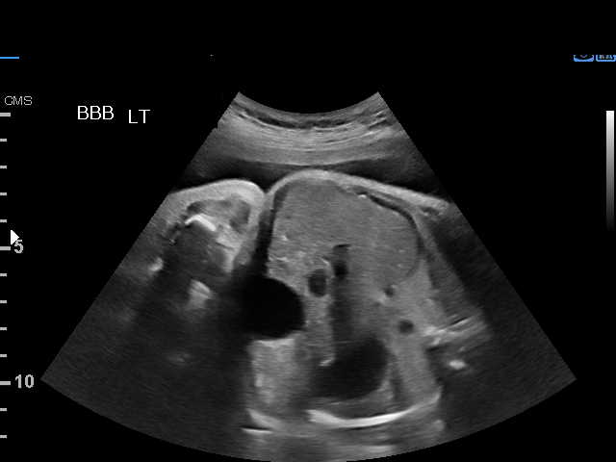
[im 97/146]
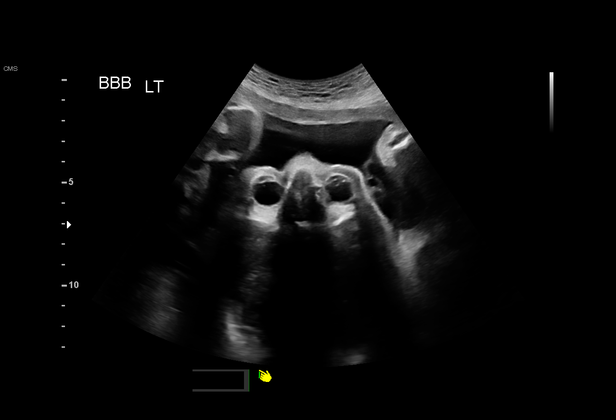
[im 108/146]
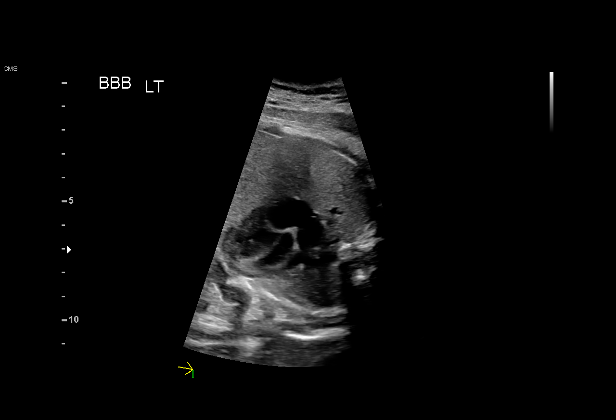
[im 119/146]
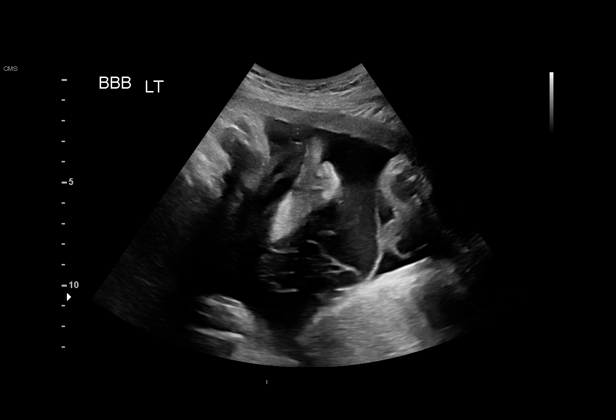
[im 129/146]
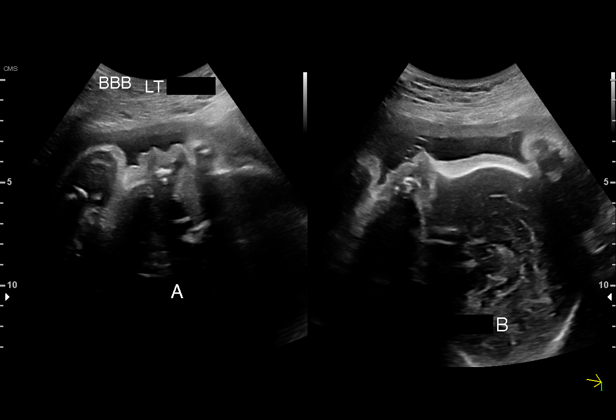
[im 140/146]
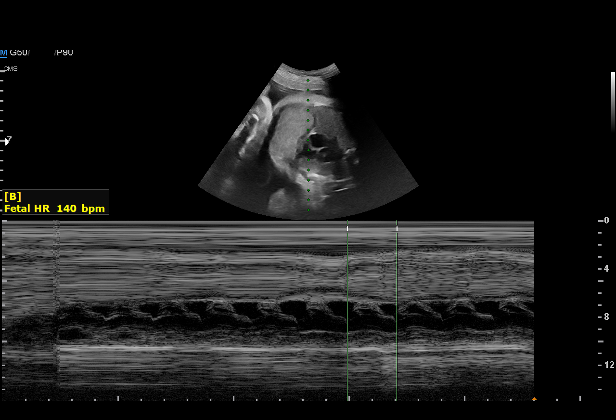

[13 of 28 positions shown; findings below may reference images not displayed]

RODRIGUEZ

    +14 WK

Indications

 31 weeks gestation of pregnancy
 Twin pregnancy, di/di, third trimester
 Antenatal screening for malformations
 Advanced maternal age multigravida 35+,
 third trimester
 Late to prenatal care, third trimester
 Gestational diabetes in pregnancy, diet
 controlled
 Low risk NIPS, neg Horizon
Fetal Evaluation (Fetus A)

 Num Of Fetuses:         2
 Fetal Heart Rate(bpm):  153
 Cardiac Activity:       Observed
 Fetal Lie:              Maternal right side
 Presentation:           Cephalic
 Placenta:               Posterior
 P. Cord Insertion:      Not well visualized
 Membrane Desc:      Dividing Membrane seen - Dichorionic.

 Amniotic Fluid
 AFI FV:      Within normal limits

                             Largest Pocket(cm)

Biometry (Fetus A)

 BPD:      84.6  mm     G. Age:  34w 1d         96  %    CI:        81.35   %    70 - 86
                                                         FL/HC:      19.7   %    19.1 -
 HC:      296.1  mm     G. Age:  32w 5d         44  %    HC/AC:      1.07        0.96 -
 AC:      277.7  mm     G. Age:  31w 6d         56  %    FL/BPD:     68.8   %    71 - 87
 FL:       58.2  mm     G. Age:  30w 3d         11  %    FL/AC:      21.0   %    20 - 24
 HUM:      52.1  mm     G. Age:  30w 3d         27  %
 LV:          7  mm

 Est. FW:    6064  gm           4 lb     41  %     FW Discordancy        12  %
OB History

 Gravidity:    3         Term:   2        Prem:   0        SAB:   0
 TOP:          0       Ectopic:  0        Living: 2
Gestational Age (Fetus A)

 LMP:           31w 4d        Date:  03/23/20                 EDD:   12/28/20
 U/S Today:     32w 2d                                        EDD:   12/23/20
 Best:          31w 4d     Det. By:  LMP  (03/23/20)          EDD:   12/28/20
Anatomy (Fetus A)

 Cranium:               Appears normal         LVOT:                   Appears normal
 Cavum:                 Not well visualized    Aortic Arch:            Appears normal
 Ventricles:            Appears normal         Ductal Arch:            Appears normal
 Choroid Plexus:        Appears normal         Diaphragm:              Appears normal
 Cerebellum:            Not well visualized    Stomach:                Appears normal, left
                                                                       sided
 Posterior Fossa:       Not well visualized    Abdomen:                Appears normal
 Nuchal Fold:           Not applicable (>20    Abdominal Wall:         Appears nml (cord
                        wks GA)                                        insert, abd wall)
 Face:                  Appears normal         Cord Vessels:           Appears normal (3
                        (orbits and profile)                           vessel cord)
 Lips:                  Appears normal         Kidneys:                Appear normal
 Palate:                Appears normal         Bladder:                Appears normal
 Thoracic:              Appears normal         Spine:                  Limited views
                                                                       appear normal
 Heart:                 Appears normal         Upper Extremities:      Visualized
                        (4CH, axis, and
                        situs)
 RVOT:                  Appears normal         Lower Extremities:      Visualized

 Other:  Technically difficult due to advanced GA and fetal position.
Targeted Anatomy (Fetus A)

 Spine
 Cervical:              Limited                Lumbar:                 Appears normal
 Thoracic:              Appears normal         Sacral:                 Appears Normal

 Head/Neck
 Nasal Bone:            Present                Maxilla:                Appears normal
 Orbits/Eyes:           Nml w/ lenses
 Thorax
 3 Vessel View:         Appears normal         3 V Trachea View:       Appears normal

 Extremities
 Lt Humerus:            Appears normal         Lt Femur:               Appears normal
 Rt Humerus:            Visualized             Rt Femur:               Appears normal
 Lt Forearm:            Appears normal         Lt Lower Leg:           Appears normal
 Rt Forearm:            Visualized             Rt Lower Leg:           Visualized

 Other
 Genitalia:             Normal Male

Fetal Evaluation (Fetus B)

 Num Of Fetuses:         2
 Fetal Heart Rate(bpm):  140
 Cardiac Activity:       Observed
 Fetal Lie:              Maternal left side
 Presentation:           Cephalic
 Placenta:               Posterior
 P. Cord Insertion:      Not well visualized
 Membrane Desc:      Dividing Membrane seen - Dichorionic.

 Amniotic Fluid
 AFI FV:      Within normal limits

                             Largest Pocket(cm)

Biometry (Fetus B)

 BPD:      80.8  mm     G. Age:  32w 3d         67  %    CI:        73.82   %    70 - 86
                                                         FL/HC:      21.0   %    19.1 -
 HC:      298.7  mm     G. Age:  33w 1d         55  %    HC/AC:      1.03        0.96 -
 AC:      289.9  mm     G. Age:  33w 0d         85  %    FL/BPD:     77.6   %    71 - 87
 FL:       62.7  mm     G. Age:  32w 3d         62  %    FL/AC:      21.6   %    20 - 24
 HUM:      53.2  mm     G. Age:  31w 0d         39  %

 LV:        4.1  mm

 Est. FW:    9169  gm      4 lb 8 oz     78  %     FW Discordancy     0 \ 12 %
Gestational Age (Fetus B)

 LMP:           31w 4d        Date:  03/23/20                 EDD:   12/28/20
 U/S Today:     32w 5d                                        EDD:   12/20/20
 Best:          31w 4d     Det. By:  LMP  (03/23/20)          EDD:   12/28/20
Anatomy (Fetus B)

 Cranium:               Appears normal         LVOT:                   Appears normal
 Cavum:                 Appears normal         Aortic Arch:            Not well visualized
 Ventricles:            Appears normal         Ductal Arch:            Not well visualized
 Choroid Plexus:        Appears normal         Diaphragm:              Appears normal
 Cerebellum:            Not well visualized    Stomach:                Appears normal, left
                                                                       sided
 Posterior Fossa:       Not well visualized    Abdomen:                Appears normal
 Nuchal Fold:           Not applicable (>20    Abdominal Wall:         Not well visualized
                        wks GA)
 Face:                  Appears normal         Cord Vessels:           Appears normal (3
                        (orbits and profile)                           vessel cord)
 Lips:                  Appears normal         Kidneys:                Appear normal
 Palate:                Not well visualized    Bladder:                Appears normal
 Thoracic:              Appears normal         Spine:                  Limited views
                                                                       appear normal
 Heart:                 Not well visualized    Upper Extremities:      RUE appears
                                                                       normal; LUE nwv
 RVOT:                  Appears normal         Lower Extremities:      RLE appears
                                                                       normal; LLE nwv

 Other:  Technicallly difficult due to advanced GA and maternal habitus.
Targeted Anatomy (Fetus B)

 Spine
 Cervical:              Limited                Lumbar:                 Appears normal
 Thoracic:              Appears normal         Sacral:                 Limited

 Head/Neck
 Orbits/Eyes:           Nml w/ lenses          Maxilla:                Appears normal
 Mandible:              Appears normal

 Thorax
 SVC:                   Appears normal         IVC:                    Appears normal
 3 Vessel View:         Appears normal

 Extremities
 Lt Humerus:            Appears normal         Lt Femur:               Appears normal
 Rt Humerus:            Appears normal         Rt Femur:               Appears normal
 Lt Forearm:            Not well visualized    Lt Lower Leg:           Visualized
 Rt Forearm:            Appears normal         Rt Lower Leg:           Not well visualized

 Other
 Genitalia:             Normal Female
Cervix Uterus Adnexa

 Cervix
 Not visualized (advanced GA >89wks)

 Uterus
 No abnormality visualized.

 Right Ovary
 Not visualized.

 Left Ovary
 Within normal limits.

 Cul De Sac
 No free fluid seen.

 Adnexa
 No abnormality visualized.
Comments

 Diamniotic Dichorionic pregnancy at an advanced gestational
 age of 31 weeks.

 Ms. Jim is a G3P2 who is here for a detailed
 examination for DiDi twin pregnancy.
 Normal anatomy with good amniotic fluid and fetal movement
 was observed in Twin A and B.
 Suboptimal views of the fetal anatomy were obtained
 secondary to fetal position and advance gestational age.
 Twin discordance of 12%.

 I reviewed the normal nature of today's ultrasound. Ms.
 Katafal conveyed that she is not taking low dose aspirin for
 preeclampsia prevention, as she was further along in
 gestation at the time of her prenatal care.

 She had a low risk NIPS and negative horizon. However, she
 was recently diagnosed with GDM but has not begun
 checking her blood sugars yet. She has her diabetic
 education visit today.

 We reviewed the sonographic findings and limitations of
 ultrasound. The potential risks associated with a twin
 gestation were discussed.  This discussion included a review
 of the increased risk of miscarriages, anomalies, preterm
 labor, and/or delivery, malpresentation, delivery via cesarean
 section, gestational diabetes, and/or preeclampsia.  With
 regards to fetal risks, there is an increased risk for fetal
 growth restriction of one or both twins, preterm labor, and
 associated morbidity, and intrauterine fetal demise.

 We recommend growth scans every 4 weeks starting at 24
 weeks with the initation of weekly antenatal testing in the
 form of twice weekly NST or weekly BPP should abnormal
 fetal growth or intertwin discordance of greater than 20-25%
 is noted.

 Regarding her new diagnosis of GDM. We discussed the
 mainstay of GDM management includes FBS 60-90 and 2hr
 pp <120 mg/dL. I discussed the role of nutrition, exercise and
 medical therapy. We focused on foods that increase blood
 sugar as well as timing and size of her meals. Lastly I
 recommended the goal of a 30 minute walk after dinner. She
 expressed an understanding of our discussion today.

 Her blood pressure was 102/61 mmHg.

 Following counseling, all questions were addressed.

 Recommendations:
 Repeat growth in 4 weeks.
 I spent 30 minutes with > 50% in face to face consultation.
